# Patient Record
Sex: Female | Born: 1995 | Hispanic: Yes | Marital: Single | Smoking: Never smoker
Health system: Southern US, Community
[De-identification: ages and names within clinical notes are randomized; demographics above are authoritative.]

## PROBLEM LIST (undated history)

## (undated) DIAGNOSIS — Z789 Other specified health status: Secondary | ICD-10-CM

## (undated) HISTORY — PX: NO PAST SURGERIES: SHX2092

---

## 2018-06-27 ENCOUNTER — Emergency Department (HOSPITAL_COMMUNITY)
Admission: EM | Admit: 2018-06-27 | Discharge: 2018-06-27 | Disposition: A | Discharge status: Home / Self Care | Payer: Self-pay | Attending: Emergency Medicine | Admitting: Emergency Medicine

## 2018-06-27 ENCOUNTER — Encounter (HOSPITAL_COMMUNITY): Payer: Self-pay | Admitting: Emergency Medicine

## 2018-06-27 ENCOUNTER — Other Ambulatory Visit: Payer: Self-pay

## 2018-06-27 DIAGNOSIS — N12 Tubulo-interstitial nephritis, not specified as acute or chronic: Secondary | ICD-10-CM

## 2018-06-27 DIAGNOSIS — N10 Acute tubulo-interstitial nephritis: Secondary | ICD-10-CM | POA: Insufficient documentation

## 2018-06-27 LAB — URINALYSIS, ROUTINE W REFLEX MICROSCOPIC
BILIRUBIN URINE: NEGATIVE
Glucose, UA: NEGATIVE mg/dL
HGB URINE DIPSTICK: NEGATIVE
KETONES UR: NEGATIVE mg/dL
NITRITE: NEGATIVE
Protein, ur: NEGATIVE mg/dL
Specific Gravity, Urine: 1.019 (ref 1.005–1.030)
pH: 8 (ref 5.0–8.0)

## 2018-06-27 LAB — I-STAT CG4 LACTIC ACID, ED
Lactic Acid, Venous: 1.27 mmol/L (ref 0.5–1.9)
Lactic Acid, Venous: 0.71 mmol/L (ref 0.5–1.9)

## 2018-06-27 LAB — COMPREHENSIVE METABOLIC PANEL
ALBUMIN: 4.2 g/dL (ref 3.5–5.0)
ALK PHOS: 78 U/L (ref 38–126)
ALT: 15 U/L (ref 0–44)
ANION GAP: 11 (ref 5–15)
AST: 19 U/L (ref 15–41)
BUN: 8 mg/dL (ref 6–20)
CALCIUM: 9 mg/dL (ref 8.9–10.3)
CHLORIDE: 108 mmol/L (ref 98–111)
CO2: 20 mmol/L — AB (ref 22–32)
Creatinine, Ser: 0.58 mg/dL (ref 0.44–1.00)
GFR calc Af Amer: >60 mL/min (ref >60)
GFR calc non Af Amer: >60 mL/min (ref >60)
Glucose, Bld: 93 mg/dL (ref 70–99)
Potassium: 4.2 mmol/L (ref 3.5–5.1)
SODIUM: 139 mmol/L (ref 135–145)
Total Bilirubin: 0.4 mg/dL (ref 0.3–1.2)
Total Protein: 7.4 g/dL (ref 6.5–8.1)

## 2018-06-27 LAB — CBC WITH DIFFERENTIAL/PLATELET
ABS IMMATURE GRANULOCYTES: 0.06 10*3/uL (ref 0.00–0.07)
BASOS ABS: 0.1 10*3/uL (ref 0.0–0.1)
Basophils Relative: 0 %
EOS ABS: 0 10*3/uL (ref 0.0–0.5)
Eosinophils Relative: 0 %
HEMATOCRIT: 41.8 % (ref 36.0–46.0)
HEMOGLOBIN: 12.9 g/dL (ref 12.0–15.0)
IMMATURE GRANULOCYTES: 0 %
LYMPHS ABS: 1.6 10*3/uL (ref 0.7–4.0)
LYMPHS PCT: 11 %
MCH: 27.2 pg (ref 26.0–34.0)
MCHC: 30.9 g/dL (ref 30.0–36.0)
MCV: 88.2 fL (ref 80.0–100.0)
MONOS PCT: 5 %
Monocytes Absolute: 0.7 10*3/uL (ref 0.1–1.0)
NEUTROS ABS: 12.5 10*3/uL — AB (ref 1.7–7.7)
NEUTROS PCT: 84 %
Platelets: 307 10*3/uL (ref 150–400)
RBC: 4.74 MIL/uL (ref 3.87–5.11)
RDW: 13.2 % (ref 11.5–15.5)
WBC: 15 10*3/uL — ABNORMAL HIGH (ref 4.0–10.5)
nRBC: 0 % (ref 0.0–0.2)

## 2018-06-27 LAB — I-STAT BETA HCG BLOOD, ED (MC, WL, AP ONLY): HCG, QUANTITATIVE: 15.9 m[IU]/mL — AB (ref <5)

## 2018-06-27 MED ORDER — ONDANSETRON HCL 4 MG PO TABS: 4.0000 mg | ORAL_TABLET | Freq: Four times a day (QID) | ORAL | 0 refills | Status: DC

## 2018-06-27 MED ORDER — SODIUM CHLORIDE 0.9 % IV BOLUS
1000.0000 mL | Freq: Once | INTRAVENOUS | Status: AC
  Administered 2018-06-27: 1000 mL via INTRAVENOUS

## 2018-06-27 MED ORDER — CEPHALEXIN 250 MG PO CAPS
500.0000 mg | ORAL_CAPSULE | Freq: Once | ORAL | Status: AC
  Administered 2018-06-27: 500 mg via ORAL
  Filled 2018-06-27: qty 2

## 2018-06-27 MED ORDER — ACETAMINOPHEN 325 MG PO TABS
650.0000 mg | ORAL_TABLET | Freq: Once | ORAL | Status: AC
  Administered 2018-06-27: 650 mg via ORAL
  Filled 2018-06-27: qty 2

## 2018-06-27 MED ORDER — ACETAMINOPHEN 325 MG PO TABS: 650.0000 mg | ORAL_TABLET | Freq: Once | ORAL | Status: DC | PRN

## 2018-06-27 MED ORDER — CEPHALEXIN 500 MG PO CAPS: 500.0000 mg | ORAL_CAPSULE | Freq: Four times a day (QID) | ORAL | 0 refills | Status: DC

## 2018-06-27 MED ORDER — ONDANSETRON 4 MG PO TBDP
4.0000 mg | ORAL_TABLET | Freq: Once | ORAL | Status: AC | PRN
  Administered 2018-06-27: 4 mg via ORAL
  Filled 2018-06-27: qty 1

## 2018-06-27 NOTE — Discharge Instructions (Addendum)
Please read attached information. If you experience any new or worsening signs or symptoms please return to the emergency room for evaluation. Please follow-up with your primary care provider or specialist as discussed. Please use medication prescribed only as directed and discontinue taking if you have any concerning signs or symptoms.   °

## 2018-06-27 NOTE — ED Provider Notes (Signed)
[facility] EMERGENCY DEPARTMENT Provider Note   CSN: [id number] Arrival date & time: 06/27/18  1652     History   Chief Complaint Chief Complaint  Patient presents with  . Urinary Tract Infection  . Abdominal Pain    HPI Cristina Thompson is a 22 y.o. female.  HPI   25 YOF presents today with complaints of urinary frequency, dysuria and right-sided flank pain.  Patient notes symptoms started 2 days ago with urinary symptoms, she notes the addition of right-sided flank pain today.  She notes that she had no fever at home but in no significant nausea or vomiting.  She denies associated abdominal pain.  She reports a history of urinary tract infections, but has not had one recently.  She denies any chronic health conditions.  Patient was seen at the health department and encouraged to come to the emergency room secondary to elevated temperature and heart rate.   History reviewed. No pertinent past medical history.  There are no active problems to display for this patient.   History reviewed. No pertinent surgical history.   OB History   None      Home Medications    Prior to Admission medications   Medication Sig Start Date End Date Taking? Authorizing Provider  cephALEXin (KEFLEX) 500 MG capsule Take 1 capsule (500 mg total) by mouth 4 (four) times daily. 06/27/18   Jerami Tammen, Tinnie Gens, PA-C  ondansetron (ZOFRAN) 4 MG tablet Take 1 tablet (4 mg total) by mouth every 6 (six) hours. 06/27/18   Eyvonne Mechanic, PA-C    Family History No family history on file.  Social History Social History   Tobacco Use  . Smoking status: Never Smoker  . Smokeless tobacco: Never Used  Substance Use Topics  . Alcohol use: Yes    Comment: social  . Drug use: Not on file     Allergies   Patient has no known allergies.   Review of Systems Review of Systems  All other systems reviewed and are negative.    Physical Exam Updated Vital Signs BP 124/75 (BP Location:  Right Arm)   Pulse (!) 113   Temp 99.3 F (37.4 C) (Oral)   Resp 20   LMP 06/27/2018 (Exact Date)   SpO2 100%   Physical Exam  Constitutional: She is oriented to person, place, and time. She appears well-developed and well-nourished.  HENT:  Head: Normocephalic and atraumatic.  Eyes: Pupils are equal, round, and reactive to light. Conjunctivae are normal. Right eye exhibits no discharge. Left eye exhibits no discharge. No scleral icterus.  Neck: Normal range of motion. No JVD present. No tracheal deviation present.  Pulmonary/Chest: Effort normal. No stridor.  Abdominal:  Minimal right-sided CVA tenderness -abdomen is soft nontender  Neurological: She is alert and oriented to person, place, and time. Coordination normal.  Psychiatric: She has a normal mood and affect. Her behavior is normal. Judgment and thought content normal.  Nursing note and vitals reviewed.    ED Treatments / Results  Labs (all labs ordered are listed, but only abnormal results are displayed) Labs Reviewed  COMPREHENSIVE METABOLIC PANEL - Abnormal; Notable for the following components:      Result Value   CO2 20 (*)    All other components within normal limits  CBC WITH DIFFERENTIAL/PLATELET - Abnormal; Notable for the following components:   WBC 15.0 (*)    Neutro Abs 12.5 (*)    All other components within normal limits  URINALYSIS, ROUTINE W  REFLEX MICROSCOPIC - Abnormal; Notable for the following components:   APPearance HAZY (*)    Leukocytes, UA SMALL (*)    Bacteria, UA RARE (*)    All other components within normal limits  I-STAT BETA HCG BLOOD, ED ([facility], [facility], [facility] ONLY) - Abnormal; Notable for the following components:   I-stat hCG, quantitative 15.9 (*)    All other components within normal limits  URINE CULTURE  I-STAT CG4 LACTIC ACID, ED  I-STAT CG4 LACTIC ACID, ED    EKG None  Radiology No results found.  Procedures Procedures (including critical care time)  Medications Ordered in  ED Medications  acetaminophen (TYLENOL) tablet 650 mg (has no administration in time range)  acetaminophen (TYLENOL) tablet 650 mg (650 mg Oral Given 06/27/18 1846)  ondansetron (ZOFRAN-ODT) disintegrating tablet 4 mg (4 mg Oral Given 06/27/18 1846)  sodium chloride 0.9 % bolus 1,000 mL (0 mLs Intravenous Stopped 06/27/18 2150)  cephALEXin (KEFLEX) capsule 500 mg (500 mg Oral Given 06/27/18 2107)     Initial Impression / Assessment and Plan / ED Course  I have reviewed the triage vital signs and the nursing notes.  Pertinent labs & imaging results that were available during my care of the patient were reviewed by me and considered in my medical decision making (see chart for details).     Labs: I-STAT lactic acid, urinalysis, CMP, CBC  Imaging:  Consults:  Therapeutics: keflex  Discharge Meds: keflex, zofran   Assessment/Plan:   22 year old female percents today with likely pyelonephritis.  She is very well-appearing in no acute distress.  She was febrile and tachycardic upon initial evaluation.  She was given a liter of fluid and antibiotics.  Her tachycardia improved, no longer febrile.  No signs of kidney stones.  She is tolerating p.o.  I see no signs or symptoms today would necessitate patient management.  Patient will follow-up immediately if she develops any new or worsening signs or symptoms.  She verbalized understanding and agreement to today's plan had no further questions or concerns the time discharge.  Final Clinical Impressions(s) / ED Diagnoses   Final diagnoses:  Pyelonephritis    ED Discharge Orders         Ordered    cephALEXin (KEFLEX) 500 MG capsule  4 times daily     06/27/18 2138    ondansetron (ZOFRAN) 4 MG tablet  Every 6 hours     06/27/18 2139           Eyvonne Mechanic, PA-C 06/27/18 2202    Tilden Fossa, MD 06/28/18 [phone]

## 2018-06-27 NOTE — ED Provider Notes (Signed)
Patient placed in Quick Look pathway, seen and evaluated   Chief Complaint: UTI symptoms  HPI: Cristina Thompson is a 22 y.o. female who presents to the ED with fever, chills, right side back pain, dysuria, frequency and urgency. Patient reports going to the health department today and being sent to the ED due to fever and tachycardia.   ROS: GU: dysuria, frequency   Physical Exam:  BP (!) 146/97 (BP Location: Right Arm)   Pulse (!) 129   Temp (!) 100.6 F (38.1 C) (Oral)   Resp 18   SpO2 100%    Gen: No distress  Neuro: Awake and Alert  Skin: Warm and dry  Abdomen: soft, right CVAT   Initiation of care has begun. The patient has been counseled on the process, plan, and necessity for staying for the completion/evaluation, and the remainder of the medical screening examination    Janne Napoleon, NP 06/27/18 Cristina Thompson    Loren Racer, MD 06/29/18 [phone]

## 2018-06-27 NOTE — ED Triage Notes (Signed)
Patient sent to ED by Health Department for further evaluation of UTI. Patient endorses lower abdominal pain, and R flank pain with dysuria and nausea since yesterday along with fevers/chills. Took Aleve this morning with some relief. LMP today. Patient states hx recurrent UTIs.

## 2018-06-29 LAB — URINE CULTURE: Culture: >=100000 — AB

## 2018-06-30 ENCOUNTER — Telehealth: Payer: Self-pay | Admitting: *Deleted

## 2018-06-30 NOTE — Telephone Encounter (Signed)
Post ED Visit - Positive Culture Follow-up: Successful Patient Follow-Up  Culture assessed and recommendations reviewed by:  []  Enzo Bi, Pharm.D. []  Celedonio Miyamoto, Pharm.D., BCPS AQ-ID []  Garvin Fila, Pharm.D., BCPS []  Georgina Pillion, [address].D., BCPS []  [place], [address].D., BCPS, AAHIVP []  Estella Husk, Pharm.D., BCPS, AAHIVP []  Lysle Pearl, PharmD, BCPS []  Phillips Climes, PharmD, BCPS []  Agapito Games, PharmD, BCPS []  Verlan Friends, PharmD  Positive urine culture  []  Patient discharged without antimicrobial prescription and treatment is now indicated [x]  Organism is resistant to prescribed ED discharge antimicrobial []  Patient with positive blood cultures  Changes discussed with ED provider:Samantha Petrucelli, PA-C New antibiotic prescription Bactrim DS 1 BID x 14 days Called to [facility] [place], [phone]  Contacted patient, date 06/30/2018, time 0900   Lysle Pearl 06/30/2018, 8:59 AM

## 2018-06-30 NOTE — Telephone Encounter (Signed)
Post ED Visit - Positive Culture Follow-up: Unsuccessful Patient Follow-up  Culture assessed and recommendations reviewed by:  []  Enzo Bi, Pharm.D. []  Celedonio Miyamoto, Pharm.D., BCPS AQ-ID []  Garvin Fila, Pharm.D., BCPS []  Georgina Pillion, [address].D., BCPS []  [place], [address].D., BCPS, AAHIVP []  Estella Husk, Pharm.D., BCPS, AAHIVP []  Sherlynn Carbon, PharmD []  Pollyann Samples, PharmD, BCPS  Positive urine culture  []  Patient discharged without antimicrobial prescription and treatment is now indicated [x]  Organism is resistant to prescribed ED discharge antimicrobial []  Patient with positive blood cultures  Plan:  D/C Cephalexin, start Bactrim DS 1 BID x 14 days/Samantha Petrucelli, PA-C  Unable to contact patient after 3 attempts, letter will be sent to address on file  Lysle Pearl 06/30/2018, 8:47 AM

## 2018-06-30 NOTE — Progress Notes (Signed)
ED Antimicrobial Stewardship Positive Culture Follow Up   Cristina Thompson is an 22 y.o. female who presented to [facility] on 06/27/2018 with a chief complaint of  Chief Complaint  Patient presents with  . Urinary Tract Infection  . Abdominal Pain    Recent Results (from the past 720 hour(s))  Urine C&S     Status: Abnormal   Collection Time: 06/27/18  6:41 PM  Result Value Ref Range Status   Specimen Description URINE, CLEAN CATCH  Final   Special Requests   Final    NONE Performed at [facility], [address] [place], [place] [address]    Culture >=100,000 COLONIES/mL STAPHYLOCOCCUS SAPROPHYTICUS (A)  Final   Report Status 06/29/2018 FINAL  Final   Organism ID, Bacteria STAPHYLOCOCCUS SAPROPHYTICUS (A)  Final      Susceptibility   Staphylococcus saprophyticus - MIC*    CIPROFLOXACIN <=0.5 SENSITIVE Sensitive     GENTAMICIN <=0.5 SENSITIVE Sensitive     NITROFURANTOIN <=16 SENSITIVE Sensitive     OXACILLIN 1 RESISTANT Resistant     TETRACYCLINE >=16 RESISTANT Resistant     VANCOMYCIN <=0.5 SENSITIVE Sensitive     TRIMETH/SULFA <=10 SENSITIVE Sensitive     CLINDAMYCIN <=0.25 SENSITIVE Sensitive     RIFAMPIN <=0.5 SENSITIVE Sensitive     Inducible Clindamycin NEGATIVE Sensitive     * >=100,000 COLONIES/mL STAPHYLOCOCCUS SAPROPHYTICUS    [x]  Treated with cephalexin, organism resistant to prescribed antimicrobial   New antibiotic prescription: Bactrim DS 1 tablet PO BID for 14 days  ED Provider: Harvie Heck, PA-C    Thank you for allowing pharmacy to be a part of this patient's care.  Bradley Ferris, PharmD 06/30/2018 8:08 AM PGY-1 Pharmacy Resident Monday - Friday phone -  [phone] Saturday - Sunday phone - [phone]

## 2019-03-30 ENCOUNTER — Other Ambulatory Visit: Payer: Self-pay

## 2019-03-30 DIAGNOSIS — Z20822 Contact with and (suspected) exposure to covid-19: Secondary | ICD-10-CM

## 2019-04-01 LAB — NOVEL CORONAVIRUS, NAA: SARS-CoV-2, NAA: NOT DETECTED

## 2019-08-25 NOTE — L&D Delivery Note (Signed)
OB/GYN Faculty Practice Delivery Note  Cristina Thompson is a 24 y.o. G1P0 s/p SVD at [redacted]w[redacted]d. She was admitted for labor.   ROM: 21h 52m with clear fluid GBS Status:  Negative/-- (11/29 0000) Maximum Maternal Temperature: 98  Labor Progress: . Initial SVE: 4cm. She then progressed to complete.   Delivery Date/Time: 12/16 at [id number] Delivery: Called to room and patient was complete and pushing. Head delivered LOA. Loose nuchal cord present. Shoulder and body delivered in usual fashion. Infant with spontaneous cry, placed on mother's abdomen, dried and stimulated. Cord clamped x 2 after 1-minute delay, and cut by FOB under my direct supervision. Cord blood drawn. Placenta delivered spontaneously with gentle cord traction. Fundus firm with massage and Pitocin. Labia, perineum, vagina, and cervix inspected inspected with no lacerations.  Baby Weight: pending  Placenta: Sent to L&D Complications: None Lacerations: none EBL: 155 mL Analgesia: none   Infant:  APGAR (1 MIN): 9   APGAR (5 MINS): 9   APGAR (10 MINS):     [name], MD [facility] Fellow, [facility], [facility] 08/08/2020, 9:17 AM

## 2020-01-31 ENCOUNTER — Other Ambulatory Visit: Payer: Self-pay

## 2020-01-31 ENCOUNTER — Encounter (HOSPITAL_COMMUNITY): Payer: Self-pay | Admitting: Obstetrics and Gynecology

## 2020-01-31 ENCOUNTER — Inpatient Hospital Stay (HOSPITAL_COMMUNITY): Payer: Medicaid Other

## 2020-01-31 ENCOUNTER — Inpatient Hospital Stay (HOSPITAL_COMMUNITY)
Admission: AD | Admit: 2020-01-31 | Discharge: 2020-01-31 | Disposition: A | Discharge status: Home / Self Care | Payer: Medicaid Other | Attending: Obstetrics and Gynecology | Admitting: Obstetrics and Gynecology

## 2020-01-31 DIAGNOSIS — O23591 Infection of other part of genital tract in pregnancy, first trimester: Secondary | ICD-10-CM | POA: Insufficient documentation

## 2020-01-31 DIAGNOSIS — B9689 Other specified bacterial agents as the cause of diseases classified elsewhere: Secondary | ICD-10-CM | POA: Diagnosis not present

## 2020-01-31 DIAGNOSIS — R109 Unspecified abdominal pain: Secondary | ICD-10-CM | POA: Diagnosis not present

## 2020-01-31 DIAGNOSIS — O2341 Unspecified infection of urinary tract in pregnancy, first trimester: Secondary | ICD-10-CM | POA: Diagnosis not present

## 2020-01-31 DIAGNOSIS — O26891 Other specified pregnancy related conditions, first trimester: Secondary | ICD-10-CM | POA: Diagnosis not present

## 2020-01-31 DIAGNOSIS — N760 Acute vaginitis: Secondary | ICD-10-CM

## 2020-01-31 DIAGNOSIS — Z3A11 11 weeks gestation of pregnancy: Secondary | ICD-10-CM | POA: Insufficient documentation

## 2020-01-31 DIAGNOSIS — Z3490 Encounter for supervision of normal pregnancy, unspecified, unspecified trimester: Secondary | ICD-10-CM

## 2020-01-31 HISTORY — DX: Other specified health status: Z78.9

## 2020-01-31 LAB — URINALYSIS, ROUTINE W REFLEX MICROSCOPIC
Bilirubin Urine: NEGATIVE
Glucose, UA: NEGATIVE mg/dL
Ketones, ur: NEGATIVE mg/dL
Nitrite: NEGATIVE
Protein, ur: 100 mg/dL — AB
Specific Gravity, Urine: 1.018 (ref 1.005–1.030)
WBC, UA: >50 WBC/hpf — ABNORMAL HIGH (ref 0–5)
pH: 5 (ref 5.0–8.0)

## 2020-01-31 LAB — CBC
HCT: 36.6 % (ref 36.0–46.0)
Hemoglobin: 12.2 g/dL (ref 12.0–15.0)
MCH: 29 pg (ref 26.0–34.0)
MCHC: 33.3 g/dL (ref 30.0–36.0)
MCV: 86.9 fL (ref 80.0–100.0)
Platelets: 276 10*3/uL (ref 150–400)
RBC: 4.21 MIL/uL (ref 3.87–5.11)
RDW: 13 % (ref 11.5–15.5)
WBC: 10.8 10*3/uL — ABNORMAL HIGH (ref 4.0–10.5)
nRBC: 0 % (ref 0.0–0.2)

## 2020-01-31 LAB — WET PREP, GENITAL
Sperm: NONE SEEN
Trich, Wet Prep: NONE SEEN
Yeast Wet Prep HPF POC: NONE SEEN

## 2020-01-31 LAB — ABO/RH: ABO/RH(D): O POS

## 2020-01-31 LAB — HIV ANTIBODY (ROUTINE TESTING W REFLEX): HIV Screen 4th Generation wRfx: NONREACTIVE

## 2020-01-31 LAB — HCG, QUANTITATIVE, PREGNANCY: hCG, Beta Chain, Quant, S: 74584 m[IU]/mL — ABNORMAL HIGH (ref <5)

## 2020-01-31 LAB — POCT PREGNANCY, URINE: Preg Test, Ur: POSITIVE — AB

## 2020-01-31 MED ORDER — METRONIDAZOLE 500 MG PO TABS: 500.0000 mg | ORAL_TABLET | Freq: Two times a day (BID) | ORAL | 0 refills | Status: DC

## 2020-01-31 MED ORDER — CEPHALEXIN 500 MG PO CAPS: 500.0000 mg | ORAL_CAPSULE | Freq: Four times a day (QID) | ORAL | 0 refills | Status: DC

## 2020-01-31 NOTE — Discharge Instructions (Signed)
Pregnancy and Urinary Tract Infection  A urinary tract infection (UTI) is an infection of any part of the urinary tract. This includes the kidneys, the tubes that connect your kidneys to your bladder (ureters), the bladder, and the tube that carries urine out of your body (urethra). These organs make, store, and get rid of urine in the body. Your health care provider may use other names to describe the infection. An upper UTI affects the ureters and kidneys (pyelonephritis). A lower UTI affects the bladder (cystitis) and urethra (urethritis). Most urinary tract infections are caused by bacteria in your genital area, around the entrance to your urinary tract (urethra). These bacteria grow and cause irritation and inflammation of your urinary tract. You are more likely to develop a UTI during pregnancy because the physical and hormonal changes your body goes through can make it easier for bacteria to get into your urinary tract. Your growing baby also puts pressure on your bladder and can affect urine flow. It is important to recognize and treat UTIs in pregnancy because of the risk of serious complications for both you and your baby. How does this affect me? Symptoms of a UTI include:  Needing to urinate right away (urgently).  Frequent urination or passing small amounts of urine frequently.  Pain or burning with urination.  Blood in the urine.  Urine that smells bad or unusual.  Trouble urinating.  Cloudy urine.  Pain in the abdomen or lower back.  Vaginal discharge. You may also have:  Vomiting or a decreased appetite.  Confusion.  Irritability or tiredness.  A fever.  Diarrhea. How does this affect my baby? An untreated UTI during pregnancy could lead to a kidney infection or a systemic infection, which can cause health problems that could affect your baby. Possible complications of an untreated UTI include:  Giving birth to your baby before 37 weeks of pregnancy  (premature).  Having a baby with a low birth weight.  Developing high blood pressure during pregnancy (preeclampsia).  Having a low hemoglobin level (anemia). What can I do to lower my risk? To prevent a UTI:  Go to the bathroom as soon as you feel the need. Do not hold urine for long periods of time.  Always wipe from front to back, especially after a bowel movement. Use each tissue one time when you wipe.  Empty your bladder after sex.  Keep your genital area dry.  Drink 6-10 glasses of water each day.  Do not douche or use deodorant sprays. How is this treated? Treatment for this condition may include:  Antibiotic medicines that are safe to take during pregnancy.  Other medicines to treat less common causes of UTI. Follow these instructions at home:  If you were prescribed an antibiotic medicine, take it as told by your health care provider. Do not stop using the antibiotic even if you start to feel better.  Keep all follow-up visits as told by your health care provider. This is important. Contact a health care provider if:  Your symptoms do not improve or they get worse.  You have abnormal vaginal discharge. Get help right away if you:  Have a fever.  Have nausea and vomiting.  Have back or side pain.  Feel contractions in your uterus.  Have lower belly pain.  Have a gush of fluid from your vagina.  Have blood in your urine. Summary  A urinary tract infection (UTI) is an infection of any part of the urinary tract, which includes the   kidneys, ureters, bladder, and urethra.  Most urinary tract infections are caused by bacteria in your genital area, around the entrance to your urinary tract (urethra).  You are more likely to develop a UTI during pregnancy.  If you were prescribed an antibiotic medicine, take it as told by your health care provider. Do not stop using the antibiotic even if you start to feel better. This information is not intended to  replace advice given to you by your health care provider. Make sure you discuss any questions you have with your health care provider. Document Revised: 12/02/2018 Document Reviewed: 07/14/2018 Elsevier Patient Education  [place].    Bacterial Vaginosis  Bacterial vaginosis is a vaginal infection that occurs when the normal balance of bacteria in the vagina is disrupted. It results from an overgrowth of certain bacteria. This is the most common vaginal infection among women ages 59-44. Because bacterial vaginosis increases your risk for STIs (sexually transmitted infections), getting treated can help reduce your risk for chlamydia, gonorrhea, herpes, and HIV (human immunodeficiency virus). Treatment is also important for preventing complications in pregnant women, because this condition can cause an early (premature) delivery. What are the causes? This condition is caused by an increase in harmful bacteria that are normally present in small amounts in the vagina. However, the reason that the condition develops is not fully understood. What increases the risk? The following factors may make you more likely to develop this condition:  Having a new sexual partner or multiple sexual partners.  Having unprotected sex.  Douching.  Having an intrauterine device (IUD).  Smoking.  Drug and alcohol abuse.  Taking certain antibiotic medicines.  Being pregnant. You cannot get bacterial vaginosis from toilet seats, bedding, swimming pools, or contact with objects around you. What are the signs or symptoms? Symptoms of this condition include:  Grey or white vaginal discharge. The discharge can also be watery or foamy.  A fish-like odor with discharge, especially after sexual intercourse or during menstruation.  Itching in and around the vagina.  Burning or pain with urination. Some women with bacterial vaginosis have no signs or symptoms. How is this diagnosed? This condition  is diagnosed based on:  Your medical history.  A physical exam of the vagina.  Testing a sample of vaginal fluid under a microscope to look for a large amount of bad bacteria or abnormal cells. Your health care provider may use a cotton swab or a small wooden spatula to collect the sample. How is this treated? This condition is treated with antibiotics. These may be given as a pill, a vaginal cream, or a medicine that is put into the vagina (suppository). If the condition comes back after treatment, a second round of antibiotics may be needed. Follow these instructions at home: Medicines  Take over-the-counter and prescription medicines only as told by your health care provider.  Take or use your antibiotic as told by your health care provider. Do not stop taking or using the antibiotic even if you start to feel better. General instructions  If you have a female sexual partner, tell her that you have a vaginal infection. She should see her health care provider and be treated if she has symptoms. If you have a female sexual partner, he does not need treatment.  During treatment: ? Avoid sexual activity until you finish treatment. ? Do not douche. ? Avoid alcohol as directed by your health care provider. ? Avoid breastfeeding as directed by your health care provider.  Drink enough water and fluids to keep your urine clear or pale yellow.  Keep the area around your vagina and rectum clean. ? Wash the area daily with warm water. ? Wipe yourself from front to back after using the toilet.  Keep all follow-up visits as told by your health care provider. This is important. How is this prevented?  Do not douche.  Wash the outside of your vagina with warm water only.  Use protection when having sex. This includes latex condoms and dental dams.  Limit how many sexual partners you have. To help prevent bacterial vaginosis, it is best to have sex with just one partner (monogamous).  Make  sure you and your sexual partner are tested for STIs.  Wear cotton or cotton-lined underwear.  Avoid wearing tight pants and pantyhose, especially during summer.  Limit the amount of alcohol that you drink.  Do not use any products that contain nicotine or tobacco, such as cigarettes and e-cigarettes. If you need help quitting, ask your health care provider.  Do not use illegal drugs. Where to find more information  Centers for Disease Control and Prevention: SolutionApps.co.za  American Sexual Health Association (ASHA): www.ashastd.org  U.S. Department of Health and Health and safety inspector, Office on Women's Health: ConventionalMedicines.si or http://www.anderson-williamson.info/ Contact a health care provider if:  Your symptoms do not improve, even after treatment.  You have more discharge or pain when urinating.  You have a fever.  You have pain in your abdomen.  You have pain during sex.  You have vaginal bleeding between periods. Summary  Bacterial vaginosis is a vaginal infection that occurs when the normal balance of bacteria in the vagina is disrupted.  Because bacterial vaginosis increases your risk for STIs (sexually transmitted infections), getting treated can help reduce your risk for chlamydia, gonorrhea, herpes, and HIV (human immunodeficiency virus). Treatment is also important for preventing complications in pregnant women, because the condition can cause an early (premature) delivery.  This condition is treated with antibiotic medicines. These may be given as a pill, a vaginal cream, or a medicine that is put into the vagina (suppository). This information is not intended to replace advice given to you by your health care provider. Make sure you discuss any questions you have with your health care provider. Document Revised: 07/23/2017 Document Reviewed: 04/25/2016 Elsevier Patient Education  2020 [employer].

## 2020-01-31 NOTE — MAU Provider Note (Signed)
Chief Complaint: Abdominal Pain   First Provider Initiated Contact with Patient 01/31/20 1603      SUBJECTIVE HPI: Theta Leaf is a 24 y.o. G1P0 at [redacted]w[redacted]d who presents to Maternity Admissions reporting: abd pain that worsened today. Thinks she may have a UTI. Has Hx recurrent UTI's, but has never been to a urologist.   Vaginal Bleeding: Denies Passage of tissue or clots: Denies Dizziness: Denies  O POS  Pain Location: suprapubic Quality: cramping Severity: 7/10 on pain scale Duration: few days Course: Worsening today Context: Early pregnancy. No US's or testing this pregnancy.  Timing: intermettent Modifying factors: Worse w/ standing.  Associated signs and symptoms: Pos for vaginal discharge, irritation, nausea, occasional vomiting in the evening.   Past Medical History:  Diagnosis Date  . Medical history non-contributory    OB History  Gravida Para Term Preterm AB Living  1            SAB TAB Ectopic Multiple Live Births               # Outcome Date GA Lbr Len/2nd Weight Sex Delivery Anes PTL Lv  1 Current            Past Surgical History:  Procedure Laterality Date  . NO PAST SURGERIES     Social History   Socioeconomic History  . Marital status: Single    Spouse name: Not on file  . Number of children: Not on file  . Years of education: Not on file  . Highest education level: Not on file  Occupational History  . Not on file  Tobacco Use  . Smoking status: Never Smoker  . Smokeless tobacco: Never Used  Substance and Sexual Activity  . Alcohol use: Not Currently    Comment: social  . Drug use: Never  . Sexual activity: Yes  Other Topics Concern  . Not on file  Social History Narrative  . Not on file   Social Determinants of Health   Financial Resource Strain:   . Difficulty of Paying Living Expenses:   Food Insecurity:   . Worried About Programme researcher, broadcasting/film/video in the Last Year:   . Barista in the Last Year:   Transportation Needs:   . Sales promotion account executive (Medical):   Marland Kitchen Lack of Transportation (Non-Medical):   Physical Activity:   . Days of Exercise per Week:   . Minutes of Exercise per Session:   Stress:   . Feeling of Stress :   Social Connections:   . Frequency of Communication with Friends and Family:   . Frequency of Social Gatherings with Friends and Family:   . Attends Religious Services:   . Active Member of Clubs or Organizations:   . Attends Banker Meetings:   Marland Kitchen Marital Status:   Intimate Partner Violence:   . Fear of Current or Ex-Partner:   . Emotionally Abused:   Marland Kitchen Physically Abused:   . Sexually Abused:    No current facility-administered medications on file prior to encounter.   Current Outpatient Medications on File Prior to Encounter  Medication Sig Dispense Refill  . Prenatal Vit-Fe Fumarate-FA (MULTIVITAMIN-PRENATAL) 27-0.8 MG TABS tablet Take 1 tablet by mouth daily at 12 noon.    . ondansetron (ZOFRAN) 4 MG tablet Take 1 tablet (4 mg total) by mouth every 6 (six) hours. 12 tablet 0   No Known Allergies  I have reviewed the past Medical Hx, Surgical Hx, Social Hx, Allergies and  Medications.   Review of Systems  Constitutional: Negative for chills and fever.  Gastrointestinal: Positive for abdominal pain, nausea and vomiting. Negative for constipation and diarrhea.  Genitourinary: Positive for vaginal discharge. Negative for difficulty urinating, dysuria, flank pain, frequency, hematuria, pelvic pain, urgency, vaginal bleeding and vaginal pain.  Musculoskeletal: Negative for back pain.    OBJECTIVE Patient Vitals for the past 24 hrs:  BP Temp Temp src Resp SpO2 Height Weight  01/31/20 1550 114/66 98.8 F (37.1 C) Oral 18 97 % -- --  01/31/20 1541 -- -- -- -- -- 5\' 3"  (1.6 m) 58.2 kg   Constitutional: Well-developed, well-nourished female in no acute distress.  Cardiovascular: normal rate Respiratory: normal rate and effort.  GI: Abd soft, mild SP tenderness. Pos BS x  4 MS: Extremities nontender, no edema, normal ROM Neurologic: Alert and oriented x 4.  GU: Neg CVAT.  SPECULUM EXAM: NEFG, mod amount of thin, white malodorous discharge, no blood noted, cervix closed; uterus ~10-week-size, no adnexal tenderness or masses.  No CMT.  LAB RESULTS Results for orders placed or performed during the hospital encounter of 01/31/20 (from the past 24 hour(s))  Pregnancy, urine POC     Status: Abnormal   Collection Time: 01/31/20  3:42 PM  Result Value Ref Range   Preg Test, Ur POSITIVE (A) NEGATIVE  Urinalysis, Routine w reflex microscopic     Status: Abnormal   Collection Time: 01/31/20  3:44 PM  Result Value Ref Range   Color, Urine YELLOW YELLOW   APPearance CLOUDY (A) CLEAR   Specific Gravity, Urine 1.018 1.005 - 1.030   pH 5.0 5.0 - 8.0   Glucose, UA NEGATIVE NEGATIVE mg/dL   Hgb urine dipstick SMALL (A) NEGATIVE   Bilirubin Urine NEGATIVE NEGATIVE   Ketones, ur NEGATIVE NEGATIVE mg/dL   Protein, ur 100 (A) NEGATIVE mg/dL   Nitrite NEGATIVE NEGATIVE   Leukocytes,Ua LARGE (A) NEGATIVE   RBC / HPF 11-20 0 - 5 RBC/hpf   WBC, UA >50 (H) 0 - 5 WBC/hpf   Bacteria, UA FEW (A) NONE SEEN   Squamous Epithelial / LPF 0-5 0 - 5   WBC Clumps PRESENT    Mucus PRESENT   CBC     Status: Abnormal   Collection Time: 01/31/20  4:13 PM  Result Value Ref Range   WBC 10.8 (H) 4.0 - 10.5 K/uL   RBC 4.21 3.87 - 5.11 MIL/uL   Hemoglobin 12.2 12.0 - 15.0 g/dL   HCT 36.6 36.0 - 46.0 %   MCV 86.9 80.0 - 100.0 fL   MCH 29.0 26.0 - 34.0 pg   MCHC 33.3 30.0 - 36.0 g/dL   RDW 13.0 11.5 - 15.5 %   Platelets 276 150 - 400 K/uL   nRBC 0.0 0.0 - 0.2 %  ABO/Rh     Status: None   Collection Time: 01/31/20  4:13 PM  Result Value Ref Range   ABO/RH(D) O POS    No rh immune globuloin      NOT A RH IMMUNE GLOBULIN CANDIDATE, PT RH POSITIVE Performed at [facility], [address] [place], [place] [address]   Wet prep, genital     Status: Abnormal   Collection Time:  01/31/20  4:16 PM   Specimen: PATH Cytology Ancillary Only  Result Value Ref Range   Yeast Wet Prep HPF POC NONE SEEN NONE SEEN   Trich, Wet Prep NONE SEEN NONE SEEN   Clue Cells Wet Prep HPF POC  PRESENT (A) NONE SEEN   WBC, Wet Prep HPF POC FEW (A) NONE SEEN   Sperm NONE SEEN     IMAGING US OB Comp Less 14 Wks  Result Date: 01/31/2020 CLINICAL DATA:  Abdominal cramping, positive urine pregnancy test EXAM: OBSTETRIC <14 WK ULTRASOUND TECHNIQUE: Transabdominal ultrasound was performed for evaluation of the gestation as well as the maternal uterus and adnexal regions. COMPARISON:  None. FINDINGS: Intrauterine gestational sac: Single Yolk sac:  Visualized. Embryo:  Visualized. Cardiac Activity: Visualized. Heart Rate: 164 bpm CRL:   45.9 mm   11 w 2 d                  [place] EDC: 08/19/2020 Subchorionic hemorrhage:  None visualized. Maternal uterus/adnexae: Left ovary measures 3.0 x 2.2 x 2.0 cm in the right ovary measures 3.1 x 2.2 x 2.3 cm. No free fluid. IMPRESSION: 1. Single live intrauterine pregnancy as above, estimated age 30 weeks and 2 days. Electronically Signed   By: Sharlet Salina M.D.   On: 01/31/2020 16:50    MAU COURSE CBC, Quant, ABO/Rh, ultrasound, wet prep and GC/chlamydia culture, UA  MDM Pain in early pregnancy with normal intrauterine pregnancy and hemodynamically stable. Pain likely 2/2 to UTI and BV. Rx Keflex,  And Flagyl. Urine culture ordered.   ASSESSMENT 1. Intrauterine pregnancy   2. Abdominal pain during pregnancy, first trimester   3. UTI (urinary tract infection) during pregnancy, first trimester   4. BV (bacterial vaginosis)     PLAN Discharge home in stable condition. First trimester and Pyelo precautions OTC Monistat PRN of yeast infection occurs  Follow-up Information    Department, [facility] Follow up on 02/05/2020.   Why: As scheduled New OB visit Contact information: [address] [place] [address] [phone]        [facility]S Maternity Assessment Unit Follow up.   Specialty: Obstetrics and Gynecology Why: As needed in pregnancy emergencies Contact information: [address] [phone] Wilhemina Bonito [place] [address] [phone]         Allergies as of 01/31/2020   No Known Allergies     Medication List    TAKE these medications   cephALEXin 500 MG capsule Commonly known as: KEFLEX Take 1 capsule (500 mg total) by mouth 4 (four) times daily.   metroNIDAZOLE 500 MG tablet Commonly known as: Flagyl Take 1 tablet (500 mg total) by mouth 2 (two) times daily.   multivitamin-prenatal 27-0.8 MG Tabs tablet Take 1 tablet by mouth daily at 12 noon.   ondansetron 4 MG tablet Commonly known as: ZOFRAN Take 1 tablet (4 mg total) by mouth every 6 (six) hours.        Katrinka Blazing, [place], CNM 01/31/2020  5:15 PM  4

## 2020-01-31 NOTE — MAU Note (Signed)
Presents with c/o abdominal cramping.  Denies VB.  +HPT.  LMP 11/13/2019.

## 2020-02-01 LAB — GC/CHLAMYDIA PROBE AMP (~~LOC~~) NOT AT ARMC
Chlamydia: NEGATIVE
Comment: NEGATIVE
Comment: NORMAL
Neisseria Gonorrhea: NEGATIVE

## 2020-02-02 LAB — CULTURE, OB URINE
Culture: 80000 — AB
Special Requests: NORMAL

## 2020-02-05 LAB — OB RESULTS CONSOLE HIV ANTIBODY (ROUTINE TESTING): HIV: NONREACTIVE

## 2020-02-05 LAB — OB RESULTS CONSOLE GC/CHLAMYDIA: Gonorrhea: NEGATIVE

## 2020-02-07 ENCOUNTER — Other Ambulatory Visit: Payer: Self-pay | Admitting: Nurse Practitioner

## 2020-02-07 DIAGNOSIS — Z3A13 13 weeks gestation of pregnancy: Secondary | ICD-10-CM

## 2020-02-07 DIAGNOSIS — Z369 Encounter for antenatal screening, unspecified: Secondary | ICD-10-CM

## 2020-02-15 ENCOUNTER — Other Ambulatory Visit: Payer: Self-pay | Admitting: Nurse Practitioner

## 2020-02-15 ENCOUNTER — Ambulatory Visit: Payer: Medicaid Other | Attending: Obstetrics and Gynecology

## 2020-02-15 ENCOUNTER — Other Ambulatory Visit: Payer: Self-pay

## 2020-02-15 ENCOUNTER — Ambulatory Visit: Payer: Medicaid Other | Admitting: *Deleted

## 2020-02-15 ENCOUNTER — Ambulatory Visit: Payer: Medicaid Other

## 2020-02-15 VITALS — BP 112/72 | HR 86 | Wt 128.0 lb

## 2020-02-15 DIAGNOSIS — Z3A13 13 weeks gestation of pregnancy: Secondary | ICD-10-CM | POA: Diagnosis not present

## 2020-02-15 DIAGNOSIS — Z3682 Encounter for antenatal screening for nuchal translucency: Secondary | ICD-10-CM

## 2020-02-15 DIAGNOSIS — Z369 Encounter for antenatal screening, unspecified: Secondary | ICD-10-CM

## 2020-02-15 DIAGNOSIS — O321XX0 Maternal care for breech presentation, not applicable or unspecified: Secondary | ICD-10-CM

## 2020-02-15 DIAGNOSIS — Z1379 Encounter for other screening for genetic and chromosomal anomalies: Secondary | ICD-10-CM

## 2020-02-21 ENCOUNTER — Telehealth: Payer: Self-pay | Admitting: Genetic Counselor

## 2020-02-21 LAB — MATERNIT21 PLUS CORE+SCA
Fetal Fraction: 11
Monosomy X (Turner Syndrome): NOT DETECTED
Result (T21): NEGATIVE
Trisomy 13 (Patau syndrome): NEGATIVE
Trisomy 18 (Edwards syndrome): NEGATIVE
Trisomy 21 (Down syndrome): NEGATIVE
XXX (Triple X Syndrome): NOT DETECTED
XXY (Klinefelter Syndrome): NOT DETECTED
XYY (Jacobs Syndrome): NOT DETECTED

## 2020-02-21 NOTE — Telephone Encounter (Signed)
Second year [employer] genetic counseling student Norlene Duel called Ms. Verline Lema to discuss her negative noninvasive prenatal screening (NIPS)/cell free DNA (cfDNA) testing result under my supervision. Specifically, Ms. Verline Lema had MaterniT21 testing through [employer]. These negative results demonstrated an expected representation of chromosome 21, 18, 13, and sex chromosome material, greatly reducing the likelihood of trisomies 41, 81, or 12 and sex chromosome aneuploidies for the pregnancy. Ms. Verline Lema requested not to know the fetal sex; however, she asked if we could call her sister Cira Rue to inform her. We called Arlene to inform her of the expected fetal sex (female).  NIPS analyzes placental DNA in maternal circulation. NIPS is considered to be highly specific and sensitive, but is not considered to be a diagnostic test. We reviewed that this testing identifies 91-99% of pregnancies with trisomies 40, 68, and 63, as well as sex chromosome aneuploidies, but does not test for all genetic conditions. Ms. Verline Lema was reminded that diagnostic testing via amniocentesis after 16 weeks' gestation is available should she be interested in confirming this result. She confirmed that she had no questions about these results at this time.  Gershon Crane, MS, [facility] Genetic Counselor

## 2020-03-04 LAB — OB RESULTS CONSOLE RUBELLA ANTIBODY, IGM: Rubella: NON-IMMUNE/NOT IMMUNE

## 2020-05-27 LAB — OB RESULTS CONSOLE HIV ANTIBODY (ROUTINE TESTING): HIV: NONREACTIVE

## 2020-06-24 ENCOUNTER — Other Ambulatory Visit: Payer: Self-pay | Admitting: Obstetrics and Gynecology

## 2020-06-24 DIAGNOSIS — O365930 Maternal care for other known or suspected poor fetal growth, third trimester, not applicable or unspecified: Secondary | ICD-10-CM

## 2020-07-02 ENCOUNTER — Other Ambulatory Visit: Payer: Self-pay

## 2020-07-02 ENCOUNTER — Encounter: Payer: Self-pay | Admitting: *Deleted

## 2020-07-02 ENCOUNTER — Ambulatory Visit: Payer: Medicaid Other | Attending: Obstetrics and Gynecology

## 2020-07-02 ENCOUNTER — Ambulatory Visit: Payer: Self-pay | Admitting: *Deleted

## 2020-07-02 VITALS — BP 113/63 | HR 85

## 2020-07-02 DIAGNOSIS — Z364 Encounter for antenatal screening for fetal growth retardation: Secondary | ICD-10-CM

## 2020-07-02 DIAGNOSIS — O365930 Maternal care for other known or suspected poor fetal growth, third trimester, not applicable or unspecified: Secondary | ICD-10-CM | POA: Insufficient documentation

## 2020-07-02 DIAGNOSIS — Z3A33 33 weeks gestation of pregnancy: Secondary | ICD-10-CM | POA: Diagnosis not present

## 2020-07-03 ENCOUNTER — Other Ambulatory Visit: Payer: Self-pay | Admitting: Obstetrics and Gynecology

## 2020-07-03 DIAGNOSIS — O365920 Maternal care for other known or suspected poor fetal growth, second trimester, not applicable or unspecified: Secondary | ICD-10-CM

## 2020-07-22 LAB — OB RESULTS CONSOLE GBS: GBS: NEGATIVE

## 2020-07-22 LAB — OB RESULTS CONSOLE GC/CHLAMYDIA
Chlamydia: NEGATIVE
Gonorrhea: NEGATIVE

## 2020-07-31 ENCOUNTER — Ambulatory Visit: Payer: Medicaid Other | Attending: Obstetrics and Gynecology

## 2020-07-31 ENCOUNTER — Encounter: Payer: Self-pay | Admitting: *Deleted

## 2020-07-31 ENCOUNTER — Ambulatory Visit: Payer: Self-pay | Admitting: *Deleted

## 2020-07-31 ENCOUNTER — Other Ambulatory Visit: Payer: Self-pay

## 2020-07-31 VITALS — BP 118/66 | HR 78

## 2020-07-31 DIAGNOSIS — Z3A37 37 weeks gestation of pregnancy: Secondary | ICD-10-CM

## 2020-07-31 DIAGNOSIS — O365990 Maternal care for other known or suspected poor fetal growth, unspecified trimester, not applicable or unspecified: Secondary | ICD-10-CM

## 2020-07-31 DIAGNOSIS — O365930 Maternal care for other known or suspected poor fetal growth, third trimester, not applicable or unspecified: Secondary | ICD-10-CM

## 2020-07-31 DIAGNOSIS — O365920 Maternal care for other known or suspected poor fetal growth, second trimester, not applicable or unspecified: Secondary | ICD-10-CM | POA: Insufficient documentation

## 2020-07-31 DIAGNOSIS — Z362 Encounter for other antenatal screening follow-up: Secondary | ICD-10-CM | POA: Diagnosis not present

## 2020-08-07 ENCOUNTER — Encounter (HOSPITAL_COMMUNITY): Payer: Self-pay | Admitting: Obstetrics and Gynecology

## 2020-08-07 ENCOUNTER — Inpatient Hospital Stay (HOSPITAL_COMMUNITY)
Admission: AD | Admit: 2020-08-07 | Discharge: 2020-08-09 | DRG: 807 | Disposition: A | Discharge status: Home / Self Care | Payer: Medicaid Other | Attending: Obstetrics & Gynecology | Admitting: Obstetrics & Gynecology

## 2020-08-07 ENCOUNTER — Other Ambulatory Visit: Payer: Self-pay

## 2020-08-07 DIAGNOSIS — Z3A38 38 weeks gestation of pregnancy: Secondary | ICD-10-CM

## 2020-08-07 DIAGNOSIS — O4292 Full-term premature rupture of membranes, unspecified as to length of time between rupture and onset of labor: Principal | ICD-10-CM | POA: Diagnosis present

## 2020-08-07 DIAGNOSIS — Z20822 Contact with and (suspected) exposure to covid-19: Secondary | ICD-10-CM | POA: Diagnosis present

## 2020-08-07 DIAGNOSIS — O26893 Other specified pregnancy related conditions, third trimester: Secondary | ICD-10-CM | POA: Diagnosis present

## 2020-08-07 DIAGNOSIS — Z23 Encounter for immunization: Secondary | ICD-10-CM | POA: Diagnosis not present

## 2020-08-07 DIAGNOSIS — O4202 Full-term premature rupture of membranes, onset of labor within 24 hours of rupture: Secondary | ICD-10-CM | POA: Diagnosis not present

## 2020-08-07 LAB — CBC
HCT: 42 % (ref 36.0–46.0)
Hemoglobin: 13.5 g/dL (ref 12.0–15.0)
MCH: 28.3 pg (ref 26.0–34.0)
MCHC: 32.1 g/dL (ref 30.0–36.0)
MCV: 88.1 fL (ref 80.0–100.0)
Platelets: 304 10*3/uL (ref 150–400)
RBC: 4.77 MIL/uL (ref 3.87–5.11)
RDW: 13.5 % (ref 11.5–15.5)
WBC: 9.1 10*3/uL (ref 4.0–10.5)
nRBC: 0 % (ref 0.0–0.2)

## 2020-08-07 LAB — AMNISURE RUPTURE OF MEMBRANE (ROM) NOT AT ARMC: Amnisure ROM: POSITIVE

## 2020-08-07 LAB — RESP PANEL BY RT-PCR (FLU A&B, COVID) ARPGX2
Influenza A by PCR: NEGATIVE
Influenza B by PCR: NEGATIVE
SARS Coronavirus 2 by RT PCR: NEGATIVE

## 2020-08-07 LAB — TYPE AND SCREEN
ABO/RH(D): O POS
Antibody Screen: NEGATIVE

## 2020-08-07 LAB — POCT FERN TEST: POCT Fern Test: NEGATIVE

## 2020-08-07 MED ORDER — MISOPROSTOL 50MCG HALF TABLET
ORAL_TABLET | ORAL | Status: AC
  Filled 2020-08-07: qty 1

## 2020-08-07 MED ORDER — ONDANSETRON HCL 4 MG/2ML IJ SOLN
4.0000 mg | Freq: Four times a day (QID) | INTRAMUSCULAR | Status: DC | PRN
  Administered 2020-08-08: 4 mg via INTRAVENOUS
  Filled 2020-08-07: qty 2

## 2020-08-07 MED ORDER — OXYCODONE-ACETAMINOPHEN 5-325 MG PO TABS: 1.0000 | ORAL_TABLET | ORAL | Status: DC | PRN

## 2020-08-07 MED ORDER — TERBUTALINE SULFATE 1 MG/ML IJ SOLN: 0.2500 mg | Freq: Once | INTRAMUSCULAR | Status: DC | PRN

## 2020-08-07 MED ORDER — LACTATED RINGERS IV SOLN: INTRAVENOUS | Status: DC

## 2020-08-07 MED ORDER — MISOPROSTOL 50MCG HALF TABLET
50.0000 ug | ORAL_TABLET | ORAL | Status: DC
  Administered 2020-08-07 – 2020-08-08 (×2): 50 ug via BUCCAL
  Filled 2020-08-07: qty 1

## 2020-08-07 MED ORDER — MISOPROSTOL 50MCG HALF TABLET
50.0000 ug | ORAL_TABLET | Freq: Once | ORAL | Status: AC
  Administered 2020-08-07: 18:00:00 50 ug via ORAL

## 2020-08-07 MED ORDER — OXYCODONE-ACETAMINOPHEN 5-325 MG PO TABS: 2.0000 | ORAL_TABLET | ORAL | Status: DC | PRN

## 2020-08-07 MED ORDER — ACETAMINOPHEN 325 MG PO TABS: 650.0000 mg | ORAL_TABLET | ORAL | Status: DC | PRN

## 2020-08-07 MED ORDER — MISOPROSTOL 50MCG HALF TABLET: 50.0000 ug | ORAL_TABLET | Freq: Once | ORAL | Status: DC

## 2020-08-07 MED ORDER — LACTATED RINGERS IV SOLN: 500.0000 mL | INTRAVENOUS | Status: DC | PRN

## 2020-08-07 MED ORDER — SOD CITRATE-CITRIC ACID 500-334 MG/5ML PO SOLN: 30.0000 mL | ORAL | Status: DC | PRN

## 2020-08-07 MED ORDER — OXYTOCIN-SODIUM CHLORIDE 30-0.9 UT/500ML-% IV SOLN
2.5000 [IU]/h | INTRAVENOUS | Status: DC
  Filled 2020-08-07: qty 500

## 2020-08-07 MED ORDER — FENTANYL CITRATE (PF) 100 MCG/2ML IJ SOLN
50.0000 ug | INTRAMUSCULAR | Status: DC | PRN
  Administered 2020-08-08: 100 ug via INTRAVENOUS
  Filled 2020-08-07: qty 2

## 2020-08-07 MED ORDER — LIDOCAINE HCL (PF) 1 % IJ SOLN: 30.0000 mL | INTRAMUSCULAR | Status: DC | PRN

## 2020-08-07 MED ORDER — OXYTOCIN BOLUS FROM INFUSION
333.0000 mL | Freq: Once | INTRAVENOUS | Status: AC
  Administered 2020-08-08: 333 mL via INTRAVENOUS

## 2020-08-07 NOTE — Progress Notes (Signed)
Patient ID: Burkley Dech, female   DOB: 04-30-1996, 24 y.o.   MRN: [id number]  Feeling a little crampy but not too uncomfortable; s/p cytotec x 1 dose; still leaking fluid  BP 132/78, P 84 FHR 150s, +accels, no decels, Cat 1 Irreg ctx Cx unchanged (1+/50/post/soft/vtx -2)  IUP@38 .2wks PROM Cx unfavorable  Cervical foley inserted without difficulty A 2nd dose of buccal cytotec given for further ripening Plan for Pitocin when the foley comes out  Arabella Merles CNM 08/07/2020

## 2020-08-07 NOTE — MAU Provider Note (Signed)
History   [id number]   Chief Complaint  Patient presents with  . Rupture of Membranes    HPI Cristina Thompson is a 24 y.o. female  G1P0 @38 .2 wks here with report of gush of brown watery fluid around 11am.  Leaking of fluid has continued. Pt reports no contractions but some mild cramping. She denies vaginal bleeding. Last intercourse was last night. She reports good fetal movement. All other systems negative.    Patient's last menstrual period was 11/13/2019.  OB History  Gravida Para Term Preterm AB Living  1         0  SAB IAB Ectopic Multiple Live Births               # Outcome Date GA Lbr Len/2nd Weight Sex Delivery Anes PTL Lv  1 Current             Past Medical History:  Diagnosis Date  . Medical history non-contributory     History reviewed. No pertinent family history.  Social History   Socioeconomic History  . Marital status: Single    Spouse name: Not on file  . Number of children: Not on file  . Years of education: Not on file  . Highest education level: Not on file  Occupational History  . Not on file  Tobacco Use  . Smoking status: Never Smoker  . Smokeless tobacco: Never Used  Vaping Use  . Vaping Use: Never used  Substance and Sexual Activity  . Alcohol use: Not Currently    Comment: social  . Drug use: Never  . Sexual activity: Yes  Other Topics Concern  . Not on file  Social History Narrative  . Not on file   Social Determinants of Health   Financial Resource Strain: Not on file  Food Insecurity: Not on file  Transportation Needs: Not on file  Physical Activity: Not on file  Stress: Not on file  Social Connections: Not on file    Allergies  Allergen Reactions  . Mango Flavor [Flavoring Agent]     No current facility-administered medications on file prior to encounter.   Current Outpatient Medications on File Prior to Encounter  Medication Sig Dispense Refill  . nitrofurantoin, macrocrystal-monohydrate, (MACROBID) 100  MG capsule Take 100 mg by mouth daily.    . Prenatal Vit-Fe Fumarate-FA (MULTIVITAMIN-PRENATAL) 27-0.8 MG TABS tablet Take 1 tablet by mouth daily at 12 noon.    . cephALEXin (KEFLEX) 500 MG capsule Take 1 capsule (500 mg total) by mouth 4 (four) times daily. (Patient not taking: Reported on 02/15/2020) 28 capsule 0  . metroNIDAZOLE (FLAGYL) 500 MG tablet Take 1 tablet (500 mg total) by mouth 2 (two) times daily. (Patient not taking: Reported on 02/15/2020) 14 tablet 0  . ondansetron (ZOFRAN) 4 MG tablet Take 1 tablet (4 mg total) by mouth every 6 (six) hours. (Patient not taking: Reported on 02/15/2020) 12 tablet 0     Review of Systems  Gastrointestinal: Negative for abdominal pain.  Genitourinary: Positive for vaginal discharge.     Physical Exam   Vitals:   08/07/20 1252  BP: 122/78  Pulse: (!) 107  Resp: 20  Temp: 98.4 F (36.9 C)  TempSrc: Oral  SpO2: 97%   Physical Exam Vitals and nursing note reviewed. Exam conducted with a chaperone present.  Constitutional:      General: She is not in acute distress.    Appearance: Normal appearance.  HENT:     Head: Normocephalic and  atraumatic.  Pulmonary:     Effort: Pulmonary effort is normal. No respiratory distress.  Genitourinary:    Comments: SSE: +pool, fern neg  Musculoskeletal:        General: Normal range of motion.  Skin:    General: Skin is warm and dry.  Neurological:     General: No focal deficit present.     Mental Status: She is alert and oriented to person, place, and time.  Psychiatric:        Mood and Affect: Mood normal.        Behavior: Behavior normal.   EFM: 145 bpm, mod variability, + accels, no decels Toco: rare Vtx confirmed by BSUS  Results for orders placed or performed during the hospital encounter of 08/07/20 (from the past 24 hour(s))  Amnisure rupture of membrane (rom)not at [facility]     Status: None   Collection Time: 08/07/20  2:41 PM  Result Value Ref Range   Amnisure ROM POSITIVE    Type and screen [facility]     Status: None (Preliminary result)   Collection Time: 08/07/20  3:50 PM  Result Value Ref Range   ABO/RH(D) PENDING    Antibody Screen PENDING    Sample Expiration      08/10/2020,2359 Performed at [facility], [address], [place], [place] [address]   POCT fern test     Status: None   Collection Time: 08/07/20  4:06 PM  Result Value Ref Range   POCT Fern Test Negative = intact amniotic membranes    MAU Course  Procedures  MDM Labs ordered and reviewed. ROM confirmed. Plan for admit.   Assessment and Plan  [redacted] weeks gestation Reactive NST  Admit to LD Mngt per labor team  Donette Larry, [place] 08/07/2020 4:06 PM

## 2020-08-07 NOTE — H&P (Addendum)
OBSTETRIC ADMISSION HISTORY AND PHYSICAL  Cristina Thompson is a 24 y.o. female G1P0 with IUP at 18w2dby LMP presenting for leakage of fluid. She reports +FMs, no VB, no blurry vision, headaches or peripheral edema, and RUQ pain.  She plans on breast feeding. She is considering an outpatient Nexplanon and will use condoms until then. [facility] --->  Estimated Date of Delivery: 08/19/20  Sono:    _0 , CWD, normal anatomy, cepahlic [id number],72%EFW   Prenatal History/Complications: -377w2destation of pregnancy -rubella non-immune  -UTI during pregnancy  Past Medical History: Past Medical History:  Diagnosis Date   Medical history non-contributory     Past Surgical History: Past Surgical History:  Procedure Laterality Date   NO PAST SURGERIES      Obstetrical History: OB History     Gravida  1   Para      Term      Preterm      AB      Living  0      SAB      IAB      Ectopic      Multiple      Live Births              Social History Social History   Socioeconomic History   Marital status: Single    Spouse name: Not on file   Number of children: Not on file   Years of education: Not on file   Highest education level: Not on file  Occupational History   Not on file  Tobacco Use   Smoking status: Never Smoker   Smokeless tobacco: Never Used  Vaping Use   Vaping Use: Never used  Substance and Sexual Activity   Alcohol use: Not Currently    Comment: social   Drug use: Never   Sexual activity: Yes  Other Topics Concern   Not on file  Social History Narrative   Not on file   Social Determinants of Health   Financial Resource Strain: Not on file  Food Insecurity: Not on file  Transportation Needs: Not on file  Physical Activity: Not on file  Stress: Not on file  Social Connections: Not on file    Family History: History reviewed. No pertinent family  history.  Allergies: Allergies  Allergen Reactions   Mango Flavor [Flavoring Agent]     Medications Prior to Admission  Medication Sig Dispense Refill Last Dose   nitrofurantoin, macrocrystal-monohydrate, (MACROBID) 100 MG capsule Take 100 mg by mouth daily.   08/06/2020 at Unknown time   Prenatal Vit-Fe Fumarate-FA (MULTIVITAMIN-PRENATAL) 27-0.8 MG TABS tablet Take 1 tablet by mouth daily at 12 noon.   08/06/2020 at Unknown time   cephALEXin (KEFLEX) 500 MG capsule Take 1 capsule (500 mg total) by mouth 4 (four) times daily. (Patient not taking: Reported on 02/15/2020) 28 capsule 0    metroNIDAZOLE (FLAGYL) 500 MG tablet Take 1 tablet (500 mg total) by mouth 2 (two) times daily. (Patient not taking: Reported on 02/15/2020) 14 tablet 0    ondansetron (ZOFRAN) 4 MG tablet Take 1 tablet (4 mg total) by mouth every 6 (six) hours. (Patient not taking: Reported on 02/15/2020) 12 tablet 0      Review of Systems   All systems reviewed and negative except as stated in HPI  Blood pressure 122/78, pulse (!) 107, temperature 98.4 F (36.9 C), temperature source Oral, resp. rate 20, last  menstrual period 11/13/2019, SpO2 97 %. General appearance: alert, cooperative and no distress Lungs: clear to auscultation bilaterally Heart: regular rate and rhythm Abdomen: soft, non-tender; gravid Extremities: no LE edema noted bilaterally, no sign of DVT Presentation: cephalic Fetal monitoringBaseline: 130 bpm, Variability: Good {> 6 bpm), Accelerations: Reactive and Decelerations: Absent Uterine activity: intermittent contractions      Prenatal labs: ABO, Rh: --/--/O POS (06/09 1613) Antibody:  negative (02/05/2020) Rubella:  non-immune (02/05/2020) RPR:   negative (07/22/2020) HBsAg:   negative (02/05/2020) HIV: Non Reactive (06/09 1613)  GBS:   negative (07/22/2020) 1 hr Glucola: 117 (05/27/2020) Genetic screening : negative  Anatomy US: normal  Prenatal Transfer Tool  Maternal Diabetes:  No Genetic Screening: Normal Maternal Ultrasounds/Referrals: Normal Fetal Ultrasounds or other Referrals:  None  Maternal Substance Abuse:  No Significant Maternal Medications:  None Significant Maternal Lab Results: Group B Strep negative  Results for orders placed or performed during the hospital encounter of 08/07/20 (from the past 24 hour(s))  Amnisure rupture of membrane (rom)not at [facility]   Collection Time: 08/07/20  2:41 PM  Result Value Ref Range   Amnisure ROM POSITIVE     There are no problems to display for this patient.   Assessment/Plan:  Cristina Thompson is a 24 y.o. G1P0 at 36w2dhere for SOL after presenting to the MAU for leakage of fluid and positive amnisure testing.  #Labor: SROM _0 . Cytotec x1 _1 . Anticipated SVD, continue to monitor.  #Pain: fentanyl prn  #FWB: Category 1  #ID: GBS negative #MOF: breast #MOC: counseling provided on options, considering nexplanon outpatient but will use condoms until then #Circ:  no #rubella non-immune status: plan for MMR prior to discharge  #UTI during pregnancy: previously treated with macrobid.  ADonney Dice DO  08/07/2020, 3:39 PM  Attestation of Supervision of Student:  I confirm that I have verified the information documented in the medical students note and that I have also personally performed the history, physical exam and all medical decision making activities.  I have verified that all services and findings are accurately documented in this student's note; and I agree with management and plan as outlined in the documentation. I have also made any necessary editorial changes.  JGabriel Carina CCortlandfor [employer] [facility] 08/07/2020 6:09 PM

## 2020-08-07 NOTE — MAU Note (Signed)
Presents with c/o "water broke", reports began leaking @ 1100, isn't currently wearing a sanitary napkin.  Denies VB.  Endorses +FM.

## 2020-08-08 ENCOUNTER — Encounter (HOSPITAL_COMMUNITY): Payer: Self-pay | Admitting: Obstetrics and Gynecology

## 2020-08-08 LAB — RPR: RPR Ser Ql: NONREACTIVE

## 2020-08-08 MED ORDER — DOCUSATE SODIUM 100 MG PO CAPS
100.0000 mg | ORAL_CAPSULE | Freq: Two times a day (BID) | ORAL | Status: DC
  Administered 2020-08-08 – 2020-08-09 (×2): 100 mg via ORAL
  Filled 2020-08-08 (×2): qty 1

## 2020-08-08 MED ORDER — IBUPROFEN 600 MG PO TABS
600.0000 mg | ORAL_TABLET | Freq: Four times a day (QID) | ORAL | Status: DC
  Administered 2020-08-08 – 2020-08-09 (×5): 600 mg via ORAL
  Filled 2020-08-08 (×5): qty 1

## 2020-08-08 MED ORDER — DIBUCAINE (PERIANAL) 1 % EX OINT: 1.0000 "application " | TOPICAL_OINTMENT | CUTANEOUS | Status: DC | PRN

## 2020-08-08 MED ORDER — SENNOSIDES-DOCUSATE SODIUM 8.6-50 MG PO TABS
2.0000 | ORAL_TABLET | ORAL | Status: DC
  Administered 2020-08-08: 15:00:00 2 via ORAL
  Filled 2020-08-08 (×2): qty 2

## 2020-08-08 MED ORDER — DIPHENHYDRAMINE HCL 25 MG PO CAPS: 25.0000 mg | ORAL_CAPSULE | Freq: Four times a day (QID) | ORAL | Status: DC | PRN

## 2020-08-08 MED ORDER — BENZOCAINE-MENTHOL 20-0.5 % EX AERO
1.0000 "application " | INHALATION_SPRAY | CUTANEOUS | Status: DC | PRN
  Administered 2020-08-08: 1 via TOPICAL
  Filled 2020-08-08: qty 56

## 2020-08-08 MED ORDER — ONDANSETRON HCL 4 MG/2ML IJ SOLN: 4.0000 mg | INTRAMUSCULAR | Status: DC | PRN

## 2020-08-08 MED ORDER — COCONUT OIL OIL: 1.0000 "application " | TOPICAL_OIL | Status: DC | PRN

## 2020-08-08 MED ORDER — ACETAMINOPHEN 325 MG PO TABS: 650.0000 mg | ORAL_TABLET | ORAL | Status: DC | PRN

## 2020-08-08 MED ORDER — OXYTOCIN-SODIUM CHLORIDE 30-0.9 UT/500ML-% IV SOLN: 1.0000 m[IU]/min | INTRAVENOUS | Status: DC

## 2020-08-08 MED ORDER — SIMETHICONE 80 MG PO CHEW
80.0000 mg | CHEWABLE_TABLET | ORAL | Status: DC | PRN
Start: 2020-08-08 — End: 2020-08-09

## 2020-08-08 MED ORDER — TERBUTALINE SULFATE 1 MG/ML IJ SOLN: 0.2500 mg | Freq: Once | INTRAMUSCULAR | Status: DC | PRN

## 2020-08-08 MED ORDER — PRENATAL MULTIVITAMIN CH
1.0000 | ORAL_TABLET | Freq: Every day | ORAL | Status: DC
  Administered 2020-08-08 – 2020-08-09 (×2): 1 via ORAL
  Filled 2020-08-08 (×2): qty 1

## 2020-08-08 MED ORDER — ONDANSETRON HCL 4 MG PO TABS: 4.0000 mg | ORAL_TABLET | ORAL | Status: DC | PRN

## 2020-08-08 MED ORDER — WITCH HAZEL-GLYCERIN EX PADS: 1.0000 "application " | MEDICATED_PAD | CUTANEOUS | Status: DC | PRN

## 2020-08-08 MED ORDER — TETANUS-DIPHTH-ACELL PERTUSSIS 5-2.5-18.5 LF-MCG/0.5 IM SUSY: 0.5000 mL | PREFILLED_SYRINGE | Freq: Once | INTRAMUSCULAR | Status: DC

## 2020-08-08 NOTE — Progress Notes (Signed)
Patient ID: Cristina Thompson, female   DOB: 1995-10-10, 24 y.o.   MRN: [id number]  S/p cervical foley and cytotec x 3 doses; Pitocin wasn't started due to reg, strong ctx  BP 138/86, P 100s FHR 140s, +accels, occ variables Ctx q 2-3 mins Cx was 4/70/vtx -2 at 0630  IUP@term  ROM x 20h Latent labor  Will recheck cx at approx 0830 and start Pitocin if no change Anticipate vag del  Arabella Merles CNM 08/08/2020

## 2020-08-08 NOTE — Discharge Summary (Signed)
°  Postpartum Discharge Summary °   °Patient Name: Cristina Thompson °DOB: 07/28/1996 °MRN: [id number] ° °Date of admission: 08/07/2020 °Delivery date:08/08/2020  °Delivering provider: MARSALA, JULIA M  °Date of discharge: 08/09/2020 ° °Admitting diagnosis: Normal labor [O80, Z37.9] °Intrauterine pregnancy: [redacted]w[redacted]d     °Secondary diagnosis:  Active Problems: °  Normal labor °  Vaginal delivery ° °Additional problems: Rubella non-immune    °Discharge diagnosis: Term Pregnancy Delivered                                              °Post partum procedures:neg °Augmentation: N/A °Complications: None ° °Hospital course: Onset of Labor With Vaginal Delivery      °24 y.o. yo G1P0 at [redacted]w[redacted]d was admitted in Latent Labor on 08/07/2020. Patient had an uncomplicated labor course as follows:  °Membrane Rupture Time/Date: 11:30 AM ,08/07/2020   °Delivery Method:Vaginal, Spontaneous  °Episiotomy: None  °Lacerations:  None  °Patient had an uncomplicated postpartum course.  She is ambulating, tolerating a regular diet, passing flatus, and urinating well. Patient is discharged home in stable condition on 08/09/20. ° °Newborn Data: °Birth date:08/08/2020  °Birth time:8:49 AM  °Gender:Female  °Living status:Living  °Apgars:9 ,9  °Weight:2775 g  ° °Magnesium Sulfate received: No °BMZ received: No °Rhophylac:N/A °MMR:N/A °T-DaP:Given prenatally °Flu: No °Transfusion:No ° °Physical exam  °Vitals:  ° 08/08/20 1530 08/08/20 1932 08/08/20 2302 08/09/20 0534  °BP: 124/79 112/68 114/70 124/87  °Pulse: 84 82 95 72  °Resp: 16 16 17 17  °Temp: 98.5 °F (36.9 °C) 98.3 °F (36.8 °C) 99 °F (37.2 °C) 98.3 °F (36.8 °C)  °TempSrc: Oral Oral Oral Oral  °SpO2: 100% 100% 99% 100%  °Weight:      °Height:      ° °General: alert, cooperative and no distress °Lochia: appropriate °Uterine Fundus: firm °Incision: N/A °DVT Evaluation: No evidence of DVT seen on physical exam. °No significant calf/ankle edema. °Labs: °Lab Results  °Component Value Date  ° WBC 9.1  08/07/2020  ° HGB 13.5 08/07/2020  ° HCT 42.0 08/07/2020  ° MCV 88.1 08/07/2020  ° PLT 304 08/07/2020  ° °CMP Latest Ref Rng & Units 06/27/2018  °Glucose 70 - 99 mg/dL 93  °BUN 6 - 20 mg/dL 8  °Creatinine 0.44 - 1.00 mg/dL 0.58  °Sodium 135 - 145 mmol/L 139  °Potassium 3.5 - 5.1 mmol/L 4.2  °Chloride 98 - 111 mmol/L 108  °CO2 22 - 32 mmol/L 20(L)  °Calcium 8.9 - 10.3 mg/dL 9.0  °Total Protein 6.5 - 8.1 g/dL 7.4  °Total Bilirubin 0.3 - 1.2 mg/dL 0.4  °Alkaline Phos 38 - 126 U/L 78  °AST 15 - 41 U/L 19  °ALT 0 - 44 U/L 15  ° °Edinburgh Score: °Edinburgh Postnatal Depression Scale Screening Tool 08/08/2020  °I have been able to laugh and see the funny side of things. 0  °I have looked forward with enjoyment to things. 0  °I have blamed myself unnecessarily when things went wrong. 0  °I have been anxious or worried for no good reason. 1  °I have felt scared or panicky for no good reason. 0  °Things have been getting on top of me. 1  °I have been so unhappy that I have had difficulty sleeping. 0  °I have felt sad or miserable. 0  °I have been so unhappy that I have been crying. 0  °  0  The thought of harming myself has occurred to me. 0  Edinburgh Postnatal Depression Scale Total 2     After visit meds:  Allergies as of 08/09/2020      Reactions   Mango Flavor [flavoring Agent]       Medication List    STOP taking these medications   cephALEXin 500 MG capsule Commonly known as: KEFLEX   metroNIDAZOLE 500 MG tablet Commonly known as: Flagyl   nitrofurantoin (macrocrystal-monohydrate) 100 MG capsule Commonly known as: MACROBID   ondansetron 4 MG tablet Commonly known as: ZOFRAN     TAKE these medications   acetaminophen 325 MG tablet Commonly known as: Tylenol Take 2 tablets (650 mg total) by mouth every 6 (six) hours as needed for mild pain, moderate pain, fever or headache (for pain scale < 4).   coconut oil Oil Apply 1 application topically as needed (nipple pain).   ibuprofen 600 MG  tablet Commonly known as: ADVIL Take 1 tablet (600 mg total) by mouth every 8 (eight) hours as needed.   multivitamin-prenatal 27-0.8 MG Tabs tablet Take 1 tablet by mouth daily at 12 noon.       Discharge home in stable condition Infant Feeding: Breast Infant Disposition:home with mother Discharge instruction: per After Visit Summary and Postpartum booklet. Activity: Advance as tolerated. Pelvic rest for 6 weeks.  Diet: routine diet Future Appointments:No future appointments. Follow up Visit: Instructed patient to follow up with [facility] for postpartum appointment in 6 weeks.  Please schedule this patient for a In person postpartum visit in 6 weeks with the following provider: Any provider. Additional Postpartum F/U: none  Low risk pregnancy complicated by: none Delivery mode:  Vaginal, Spontaneous  Anticipated Birth Control:  Nexplanon as outpatient   Nolin Grell, Gildardo Cranker, MD OB Fellow, [facility] 08/09/2020 7:53 AM

## 2020-08-08 NOTE — Lactation Note (Signed)
This note was copied from a baby's chart. Lactation Consultation Note  Patient Name: Cristina Thompson Date: 08/08/2020 Reason for consult: Initial assessment;Primapara;1st time breastfeeding;Early term 37-38.6wks  LC called to L&D for first feeding.   Baby STS, 30 minutes old.  Baby showing some cues.  Mom in laid back position.  Pillows under both arms to elevate breast.  Baby gently guided over breast and he easily opened and latched.  Colostrum drops noted on nipples.   Left baby latched and nursing after 10 mins.  Mom aware of importance of STS with baby and offering breast with cues.  To ask for help prn.  Feeding Feeding Type: Breast Fed  LATCH Score Latch: Grasps breast easily, tongue down, lips flanged, rhythmical sucking.  Audible Swallowing: Spontaneous and intermittent  Type of Nipple: Everted at rest and after stimulation (short wide nipples)  Comfort (Breast/Nipple): Soft / non-tender  Hold (Positioning): Assistance needed to correctly position infant at breast and maintain latch.  LATCH Score: 9  Interventions Interventions: Breast feeding basics reviewed;Assisted with latch;Skin to skin;Breast massage;Hand express;Adjust position;Support pillows;Position options    Consult Status Consult Status: Follow-up Date: 08/09/20 Follow-up type: In-patient    Cristina Thompson 08/08/2020, 9:34 AM

## 2020-08-08 NOTE — Plan of Care (Signed)
  Problem: Education: Goal: Knowledge of condition will improve Note: Admission education, safety and unit protocols reviewed with patient and significant other. Instructed patient to call for assistance to the bathroom for the first time. During one hour assessment, assisted patient to the bathroom, in which she voided large amount and did well ambulating. Encouraged patient to call as needed; however, she can go to the restroom on her own now. Earl Gala, Linda Hedges [place]

## 2020-08-09 ENCOUNTER — Encounter (HOSPITAL_COMMUNITY): Payer: Self-pay | Admitting: Obstetrics and Gynecology

## 2020-08-09 MED ORDER — ACETAMINOPHEN 325 MG PO TABS
650.0000 mg | ORAL_TABLET | Freq: Four times a day (QID) | ORAL | Status: AC | PRN
Start: 2020-08-09 — End: ?

## 2020-08-09 MED ORDER — COCONUT OIL OIL: 1.0000 "application " | TOPICAL_OIL | 0 refills | Status: AC | PRN

## 2020-08-09 MED ORDER — IBUPROFEN 600 MG PO TABS: 600.0000 mg | ORAL_TABLET | Freq: Three times a day (TID) | ORAL | 0 refills | Status: AC | PRN

## 2020-08-09 MED ORDER — MEASLES, MUMPS & RUBELLA VAC IJ SOLR
0.5000 mL | Freq: Once | INTRAMUSCULAR | Status: AC
  Administered 2020-08-09: 12:00:00 0.5 mL via SUBCUTANEOUS

## 2020-08-09 NOTE — Lactation Note (Signed)
This note was copied from a baby's chart. Lactation Consultation Note  Patient Name: Cristina Thompson Date: 08/09/2020 Reason for consult: Follow-up assessment  Infant is 73 hrs old; Corrie Dandy, RN called to let me know that infant was cueing.  I assisted with latch, but noted that infant fell asleep soon after latching despite breast compressions and attempts to keep infant awake. Parents endorsed that is what he had been doing.  Parents were fine with supplementing with formula, as infant's intake at the breast was likely negligible. Paced bottle-feeding was taught to parents. I talked to Mom about her options and she said that she was likely going to just pump & BO as that had been her intent, anyway.   I provided Mom with a hand pump and sized her for flange fit: a size 27 flange is a good fit for her at this time. I demonstrated how to disassemble and reassemble parts for washing. I encouraged Mom to pump every time infant receives formula. I feel that Mom will likely have an abundant supply once her milk comes to volume.  Breastfeeding brochure provided. A note was sent via Epic request previously to lactation clinic. Mom knows she can decline appt when they call her, if she feels appt is no longer necessary.   Lurline Hare [facility] 08/09/2020, 2:07 PM

## 2020-08-09 NOTE — Discharge Instructions (Signed)

## 2020-08-09 NOTE — Lactation Note (Signed)
This note was copied from a baby's chart. Lactation Consultation Note  Patient Name: Cristina Thompson [id number] Date: 08/09/2020    Infant is 9 hrs old; Mom/baby will be discharged soon. Mom comments that her nipples are "burning" when feeding. Mom says they burn the entire feeding, not just at the beginning. Mom's nipples are atraumatic although slightly pink. Mom says she feels like nipples are pinched or creased when infant releases latch.   Mom's breasts are filling; Mom is in agreement that her breasts are heavier. It was easy to express colostrum with hand expression. Mom says she has a hand pump at home.  Infant was sleeping in bassinet; infant was awakened to eat, but infant cried upon attempting to latch & then fell asleep on Mom's chest.   Specifics of an asymmetric latch were shown via [employer]. After seeing that animation, Mom stated that it was helpful to see b/c she had been latching infant with both lips making contact at the same time (instead of bottom lip touching first).   Mom had been using a cool washcloth on her nipples b/c of the burning sensation. Comfort Gels were provided w/instructions for use.   Feeding frequency was reviewed along with delaying pacifier use for the time being.   Since infant didn't latch during my time in the room, Mom knows that she can call for me to return if infant cues before leaving. I also made [place], RN & Corrie Dandy, RN aware that I could return, if needed.  Mom knows how to reach [place] for any post-discharge questions.       Lurline Hare [facility] 08/09/2020, 12:35 PM

## 2021-06-11 IMAGING — US US OB COMP LESS 14 WK
1 series · 15 of 28 positions shown · non-contrast
Comparison: None.

CLINICAL DATA: Abdominal cramping, positive urine pregnancy test

EXAM:
OBSTETRIC <14 WK ULTRASOUND
TECHNIQUE: Transabdominal ultrasound was performed for evaluation of the
gestation as well as the maternal uterus and adnexal regions.

[Series 1: us ob comp less 14 wk · 15 of 32 slices shown]
[im 1/32]
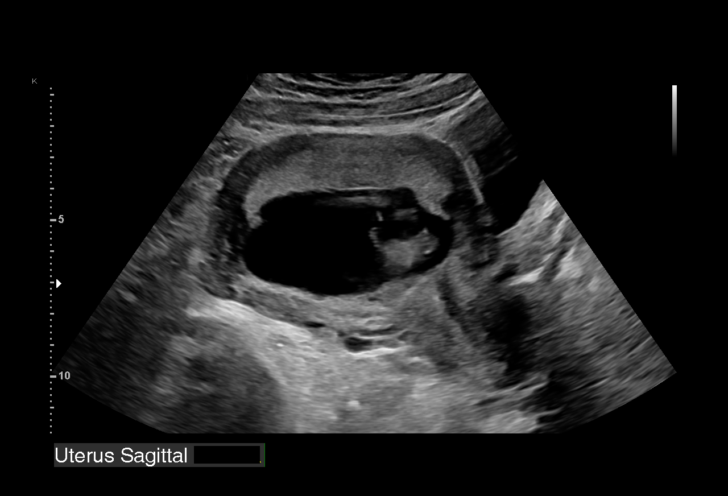
[im 3/32]
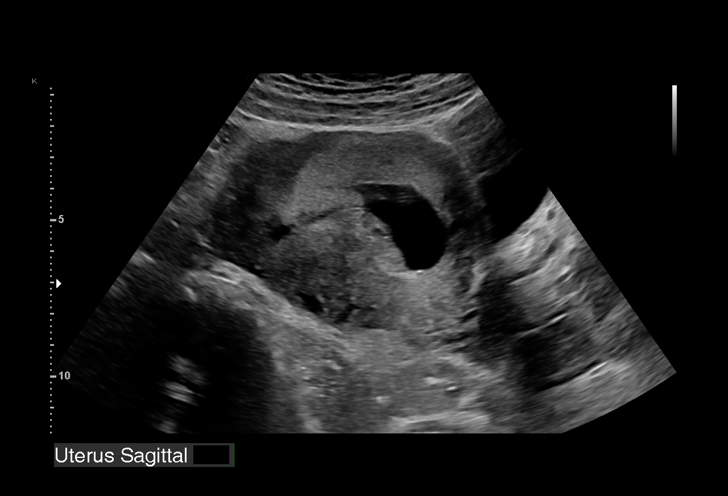
[im 5/32]
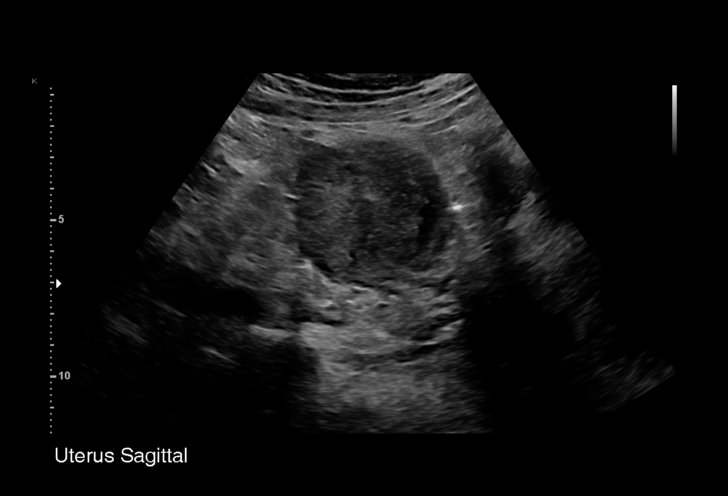
[im 7/32]
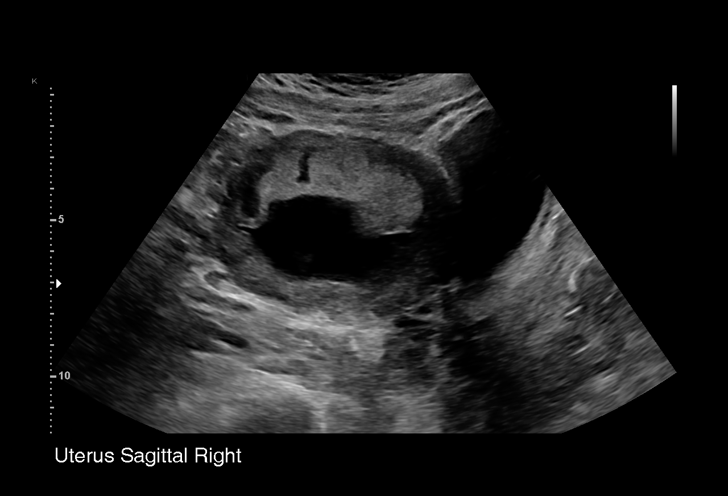
[im 10/32]
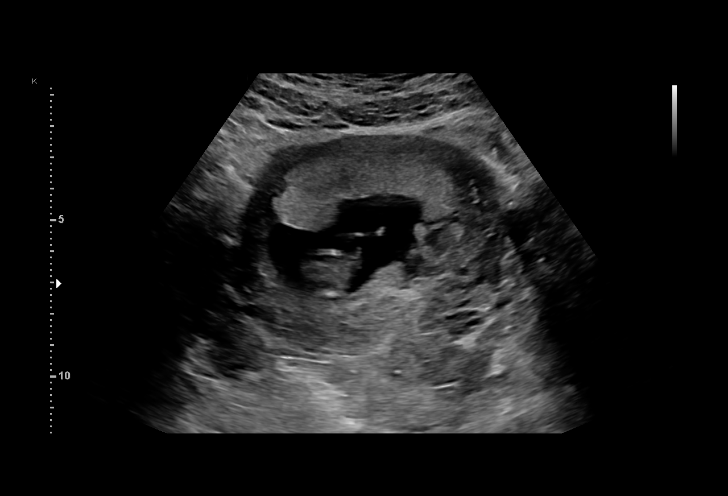
[im 12/32]
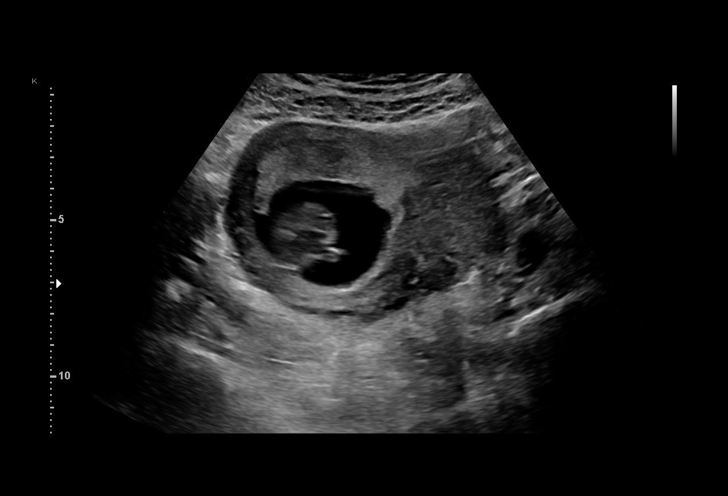
[im 14/32]
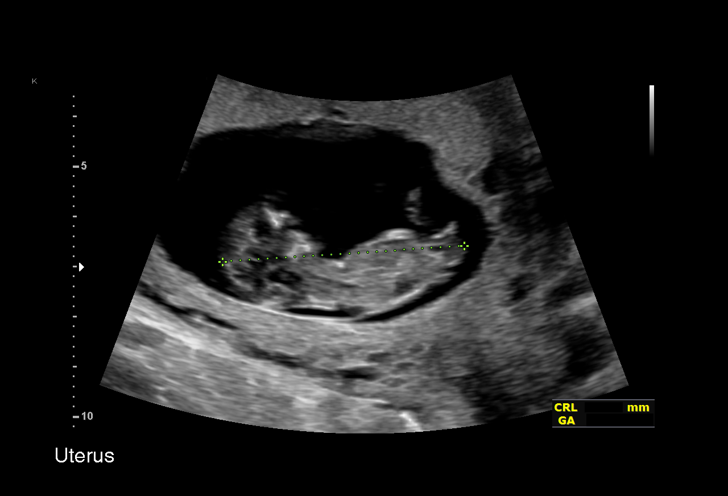
[im 17/32]
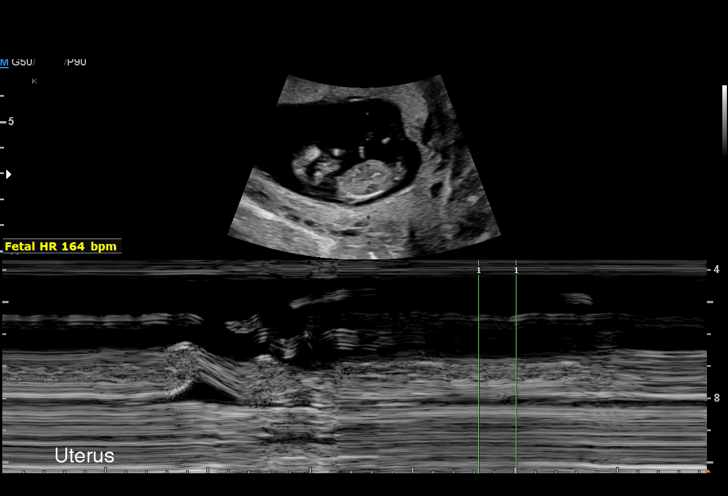
[im 18/32]
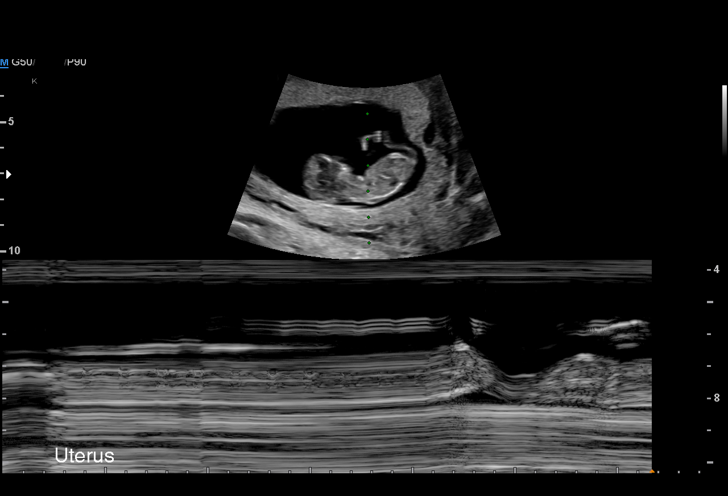
[im 20/32]
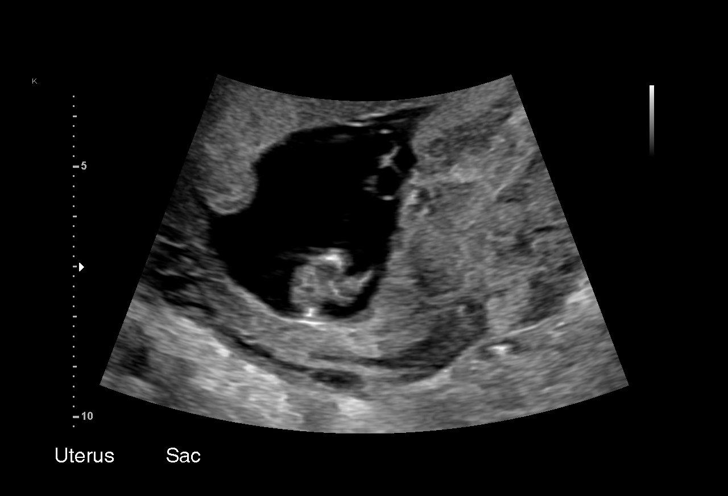
[im 22/32]
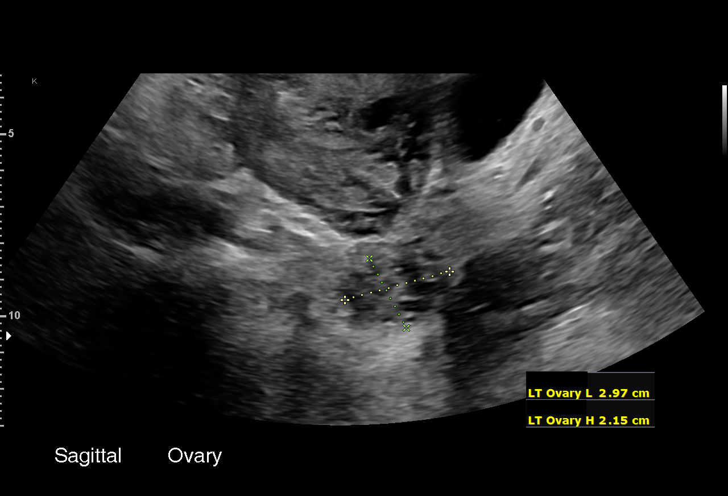
[im 25/32]
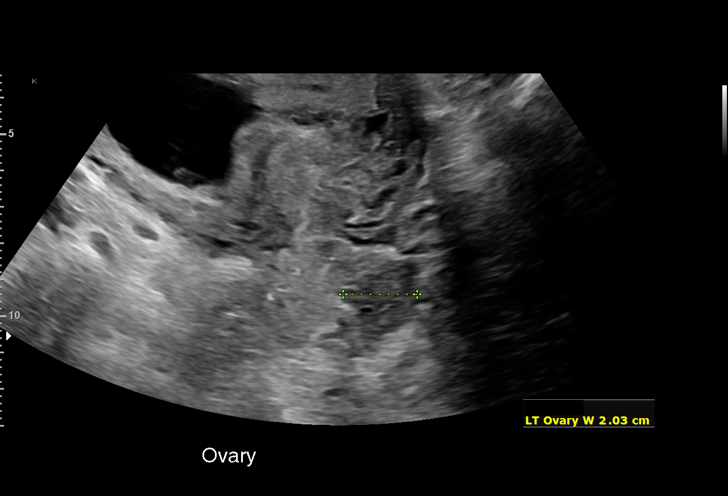
[im 27/32]
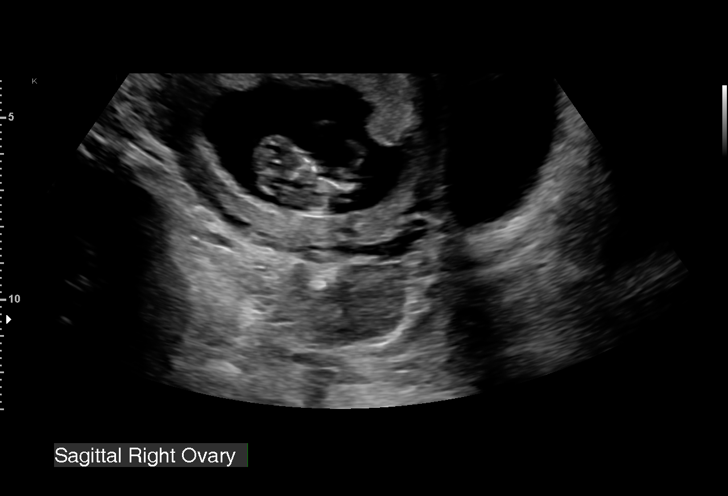
[im 29/32]
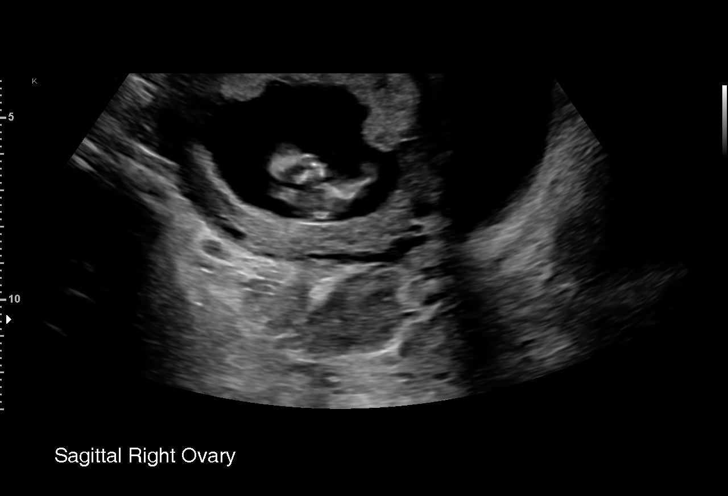
[im 32/32]
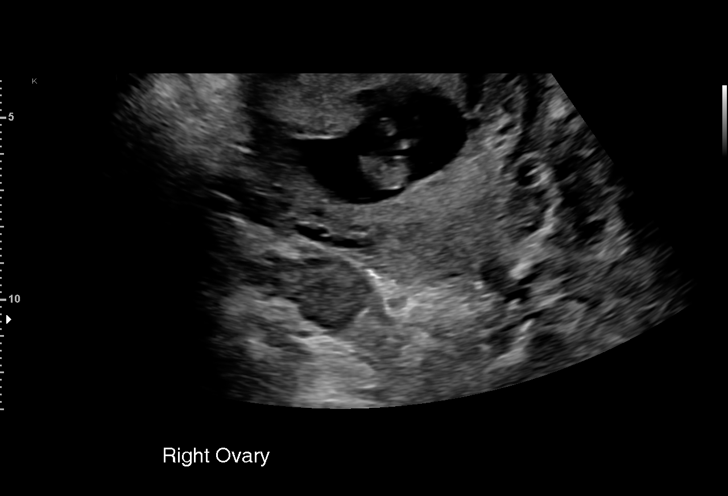

[15 of 28 positions shown; findings below may reference images not displayed]

FINDINGS: Intrauterine gestational sac: Single

Yolk sac:  Visualized.

Embryo:  Visualized.

Cardiac Activity: Visualized.

Heart Rate: 164 bpm

CRL:   45.9 mm   11 w 2 d                  US EDC: 08/19/2020

Subchorionic hemorrhage:  None visualized.

Maternal uterus/adnexae: Left ovary measures 3.0 x 2.2 x 2.0 cm in
the right ovary measures 3.1 x 2.2 x 2.3 cm. No free fluid.
IMPRESSION: 1. Single live intrauterine pregnancy as above, estimated age 11
weeks and 2 days.

## 2021-06-26 IMAGING — US US MFM OB COMPLETE +14 WKS
1 series · 15 of 28 positions shown · non-contrast
Comparison: none

[Series 1: us mfm ob complete +14 wks · 15 of 40 slices shown]
[im 1/40]
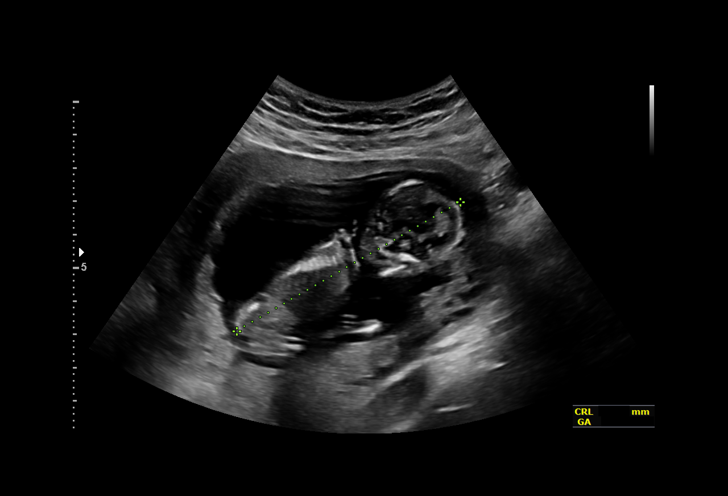
[im 3/40]
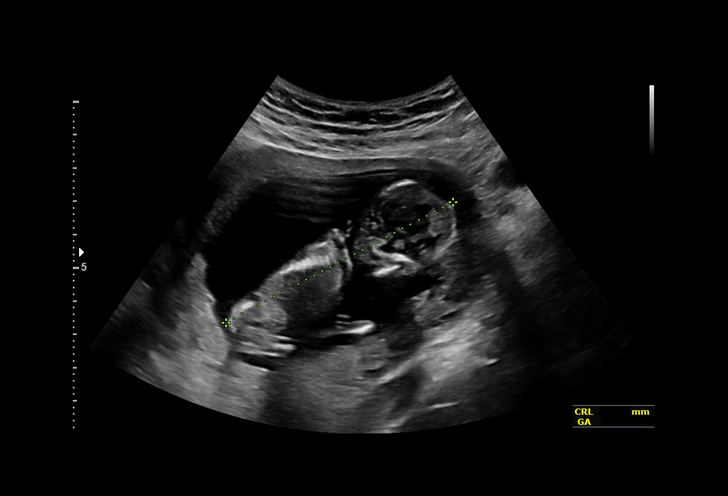
[im 6/40]
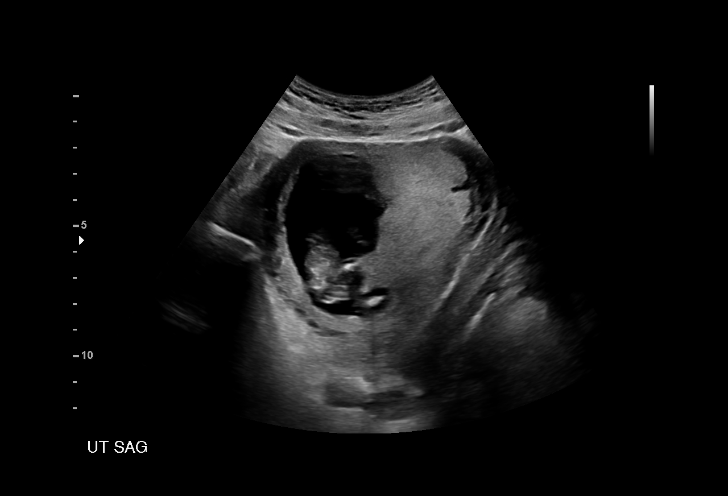
[im 9/40]
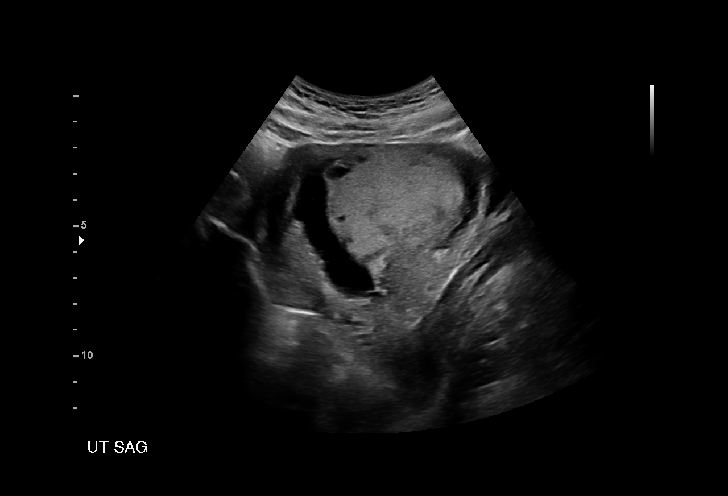
[im 12/40]
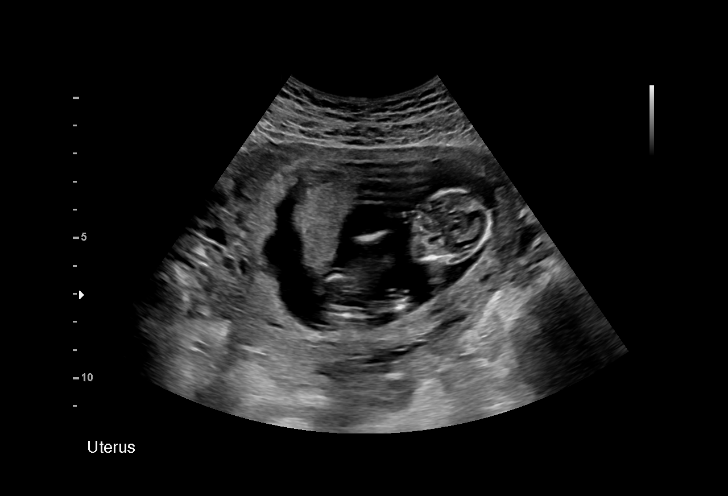
[im 15/40]
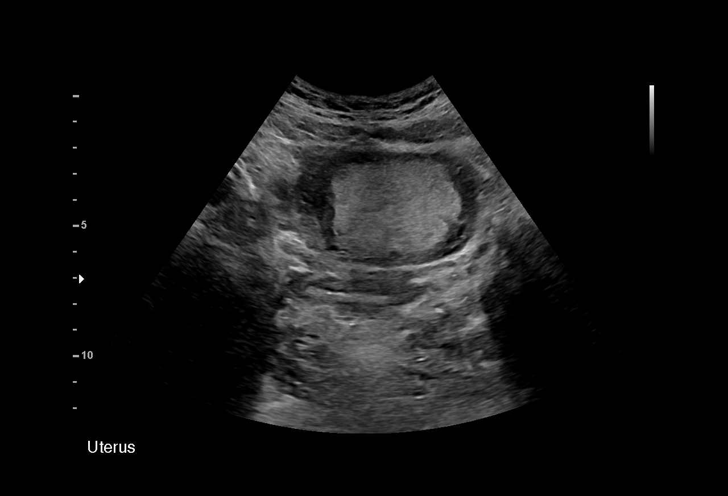
[im 18/40]
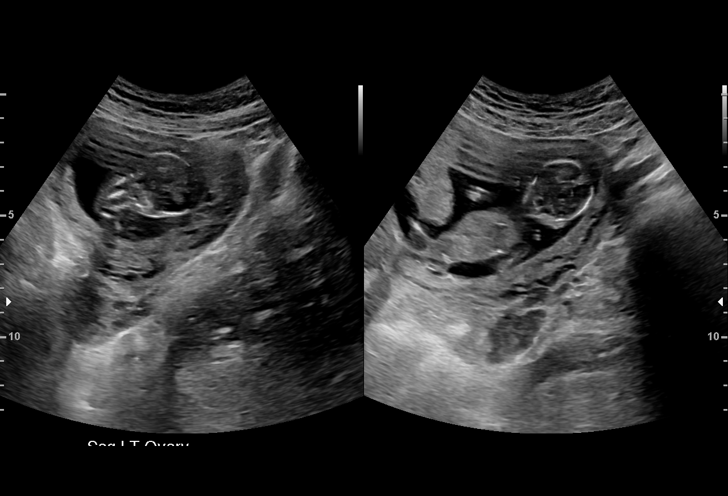
[im 21/40]
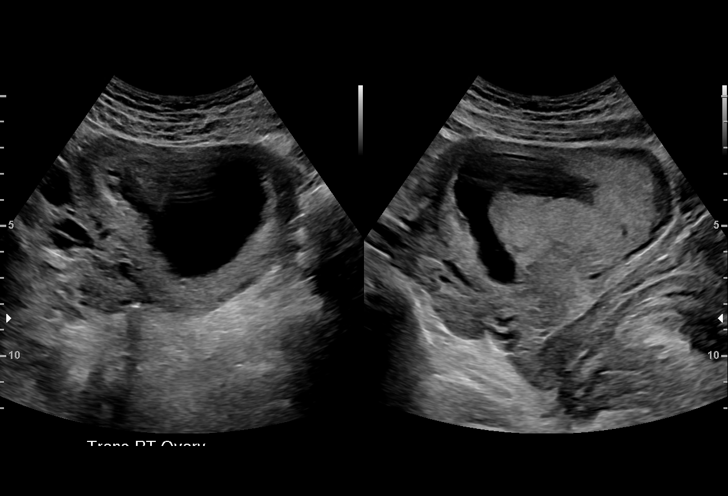
[im 22/40]
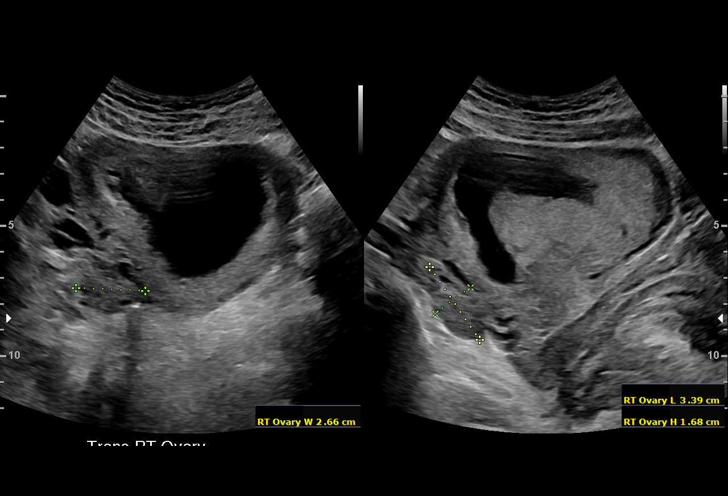
[im 25/40]
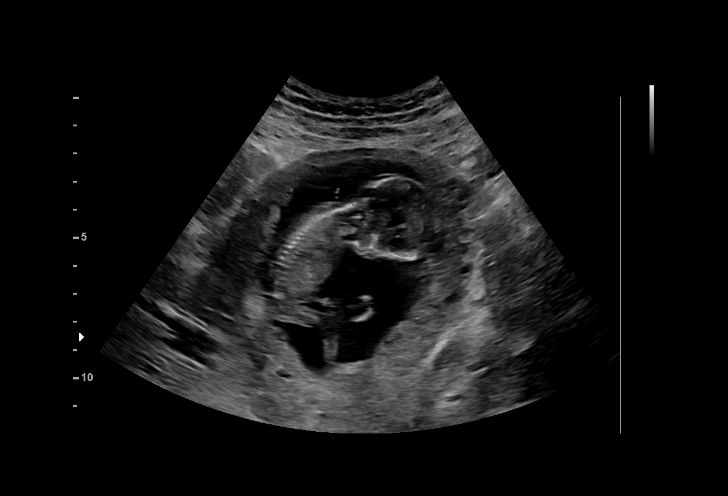
[im 28/40]
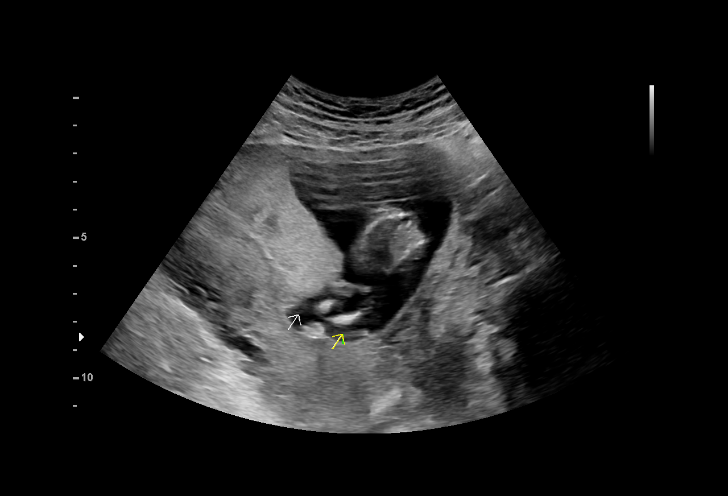
[im 31/40]
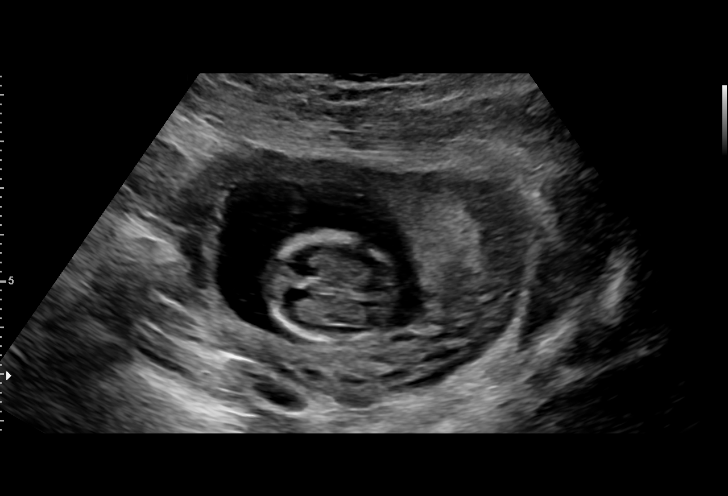
[im 34/40]
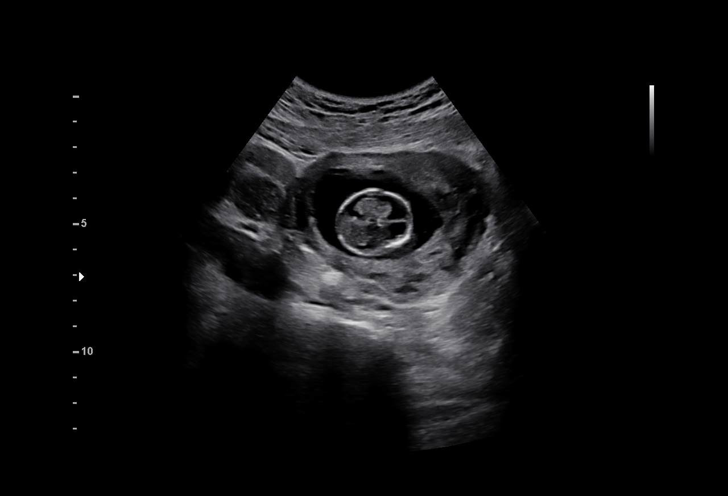
[im 37/40]
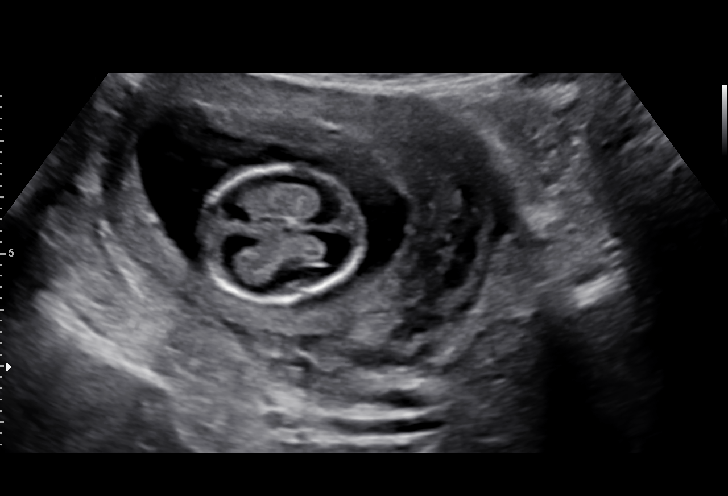
[im 40/40]
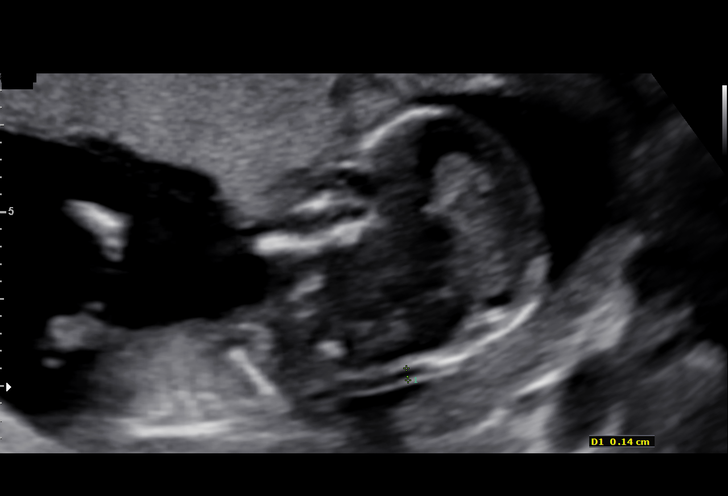

[15 of 28 positions shown; findings below may reference images not displayed]

GENNADIY NP

 1  US MFM OB COMP LESS THAN              76801.4     RAFAIZZ JADUL
    14 WEEKS

Indications

 Encounter for nuchal translucency
 13 weeks gestation of pregnancy
Fetal Evaluation

 Num Of Fetuses:         1
 Fetal Heart Rate(bpm):  151
 Cardiac Activity:       Observed
 Presentation:           Breech

 Amniotic Fluid
 AFI FV:      Within normal limits
Biometry

 CRL:     77.41  mm     G. Age:  13w 3d                  EDD:   08/19/20
OB History

 Gravidity:    1
Gestational Age

 LMP:           13w 3d        Date:  11/13/19                 EDD:   08/19/20
 Best:          13w 3d     Det. By:  LMP  (11/13/19)          EDD:   08/19/20
Anatomy

 Choroid Plexus:        Appears normal         Upper Extremities:      Visualized
 Stomach:               Appears normal, left   Lower Extremities:      Visualized
                        sided
Cervix Uterus Adnexa

 Cervix
 Normal appearance by transabdominal scan.

 Uterus
 No abnormality visualized.

 Right Ovary
 Within normal limits.

 Left Ovary
 Within normal limits.
Comments

 This patient was seen for a first trimester nuchal translucency
 measurement.  She denies any problems in her current
 pregnancy and denies any significant past medical history.

 The crown-rump length measured today is consistent with her
 gestational age.

 The nuchal translucency measurement was 1.4 mm, which is
 within normal limits.

 The patient will have blood work drawn following today's
 ultrasound exam for a cell free DNA test.

 She should have a fetal anatomy scan performed at around
 19 weeks.
